# Patient Record
Sex: Female | Born: 1965 | Race: Black or African American | Hispanic: No | Marital: Married | Smoking: Never smoker
Health system: Southern US, Community
[De-identification: ages and names within clinical notes are randomized; demographics above are authoritative.]

---

## 1998-02-08 ENCOUNTER — Other Ambulatory Visit: Admission: RE | Admit: 1998-02-08 | Discharge: 1998-02-08 | Payer: Self-pay | Admitting: *Deleted

## 1998-04-01 ENCOUNTER — Inpatient Hospital Stay (HOSPITAL_COMMUNITY): Admission: RE | Admit: 1998-04-01 | Discharge: 1998-04-02 | Payer: Self-pay | Admitting: *Deleted

## 1999-02-11 ENCOUNTER — Emergency Department (HOSPITAL_COMMUNITY): Admission: EM | Admit: 1999-02-11 | Discharge: 1999-02-11 | Payer: Self-pay | Admitting: Emergency Medicine

## 2005-06-12 ENCOUNTER — Emergency Department (HOSPITAL_COMMUNITY): Admission: EM | Admit: 2005-06-12 | Discharge: 2005-06-12 | Payer: Self-pay | Admitting: *Deleted

## 2010-05-01 ENCOUNTER — Emergency Department (HOSPITAL_COMMUNITY)
Admission: EM | Admit: 2010-05-01 | Discharge: 2010-05-02 | Disposition: A | Discharge status: Home / Self Care | Payer: BC Managed Care – PPO | Attending: Emergency Medicine | Admitting: Emergency Medicine

## 2010-05-01 DIAGNOSIS — R490 Dysphonia: Secondary | ICD-10-CM | POA: Insufficient documentation

## 2010-05-01 DIAGNOSIS — R05 Cough: Secondary | ICD-10-CM | POA: Insufficient documentation

## 2010-05-01 DIAGNOSIS — R5381 Other malaise: Secondary | ICD-10-CM | POA: Insufficient documentation

## 2010-05-01 DIAGNOSIS — R6889 Other general symptoms and signs: Secondary | ICD-10-CM | POA: Insufficient documentation

## 2010-05-01 DIAGNOSIS — I517 Cardiomegaly: Secondary | ICD-10-CM | POA: Insufficient documentation

## 2010-05-01 DIAGNOSIS — R5383 Other fatigue: Secondary | ICD-10-CM | POA: Insufficient documentation

## 2010-05-01 DIAGNOSIS — R059 Cough, unspecified: Secondary | ICD-10-CM | POA: Insufficient documentation

## 2010-05-01 DIAGNOSIS — J069 Acute upper respiratory infection, unspecified: Secondary | ICD-10-CM | POA: Insufficient documentation

## 2010-05-01 DIAGNOSIS — J3489 Other specified disorders of nose and nasal sinuses: Secondary | ICD-10-CM | POA: Insufficient documentation

## 2010-05-02 ENCOUNTER — Emergency Department (HOSPITAL_COMMUNITY): Payer: BC Managed Care – PPO

## 2012-10-01 IMAGING — CR DG CHEST 2V
2 series · 2 of 2 positions shown · non-contrast
Comparison: None.

CLINICAL DATA: Productive cough.

CHEST - 2 VIEW

[w chest pa]
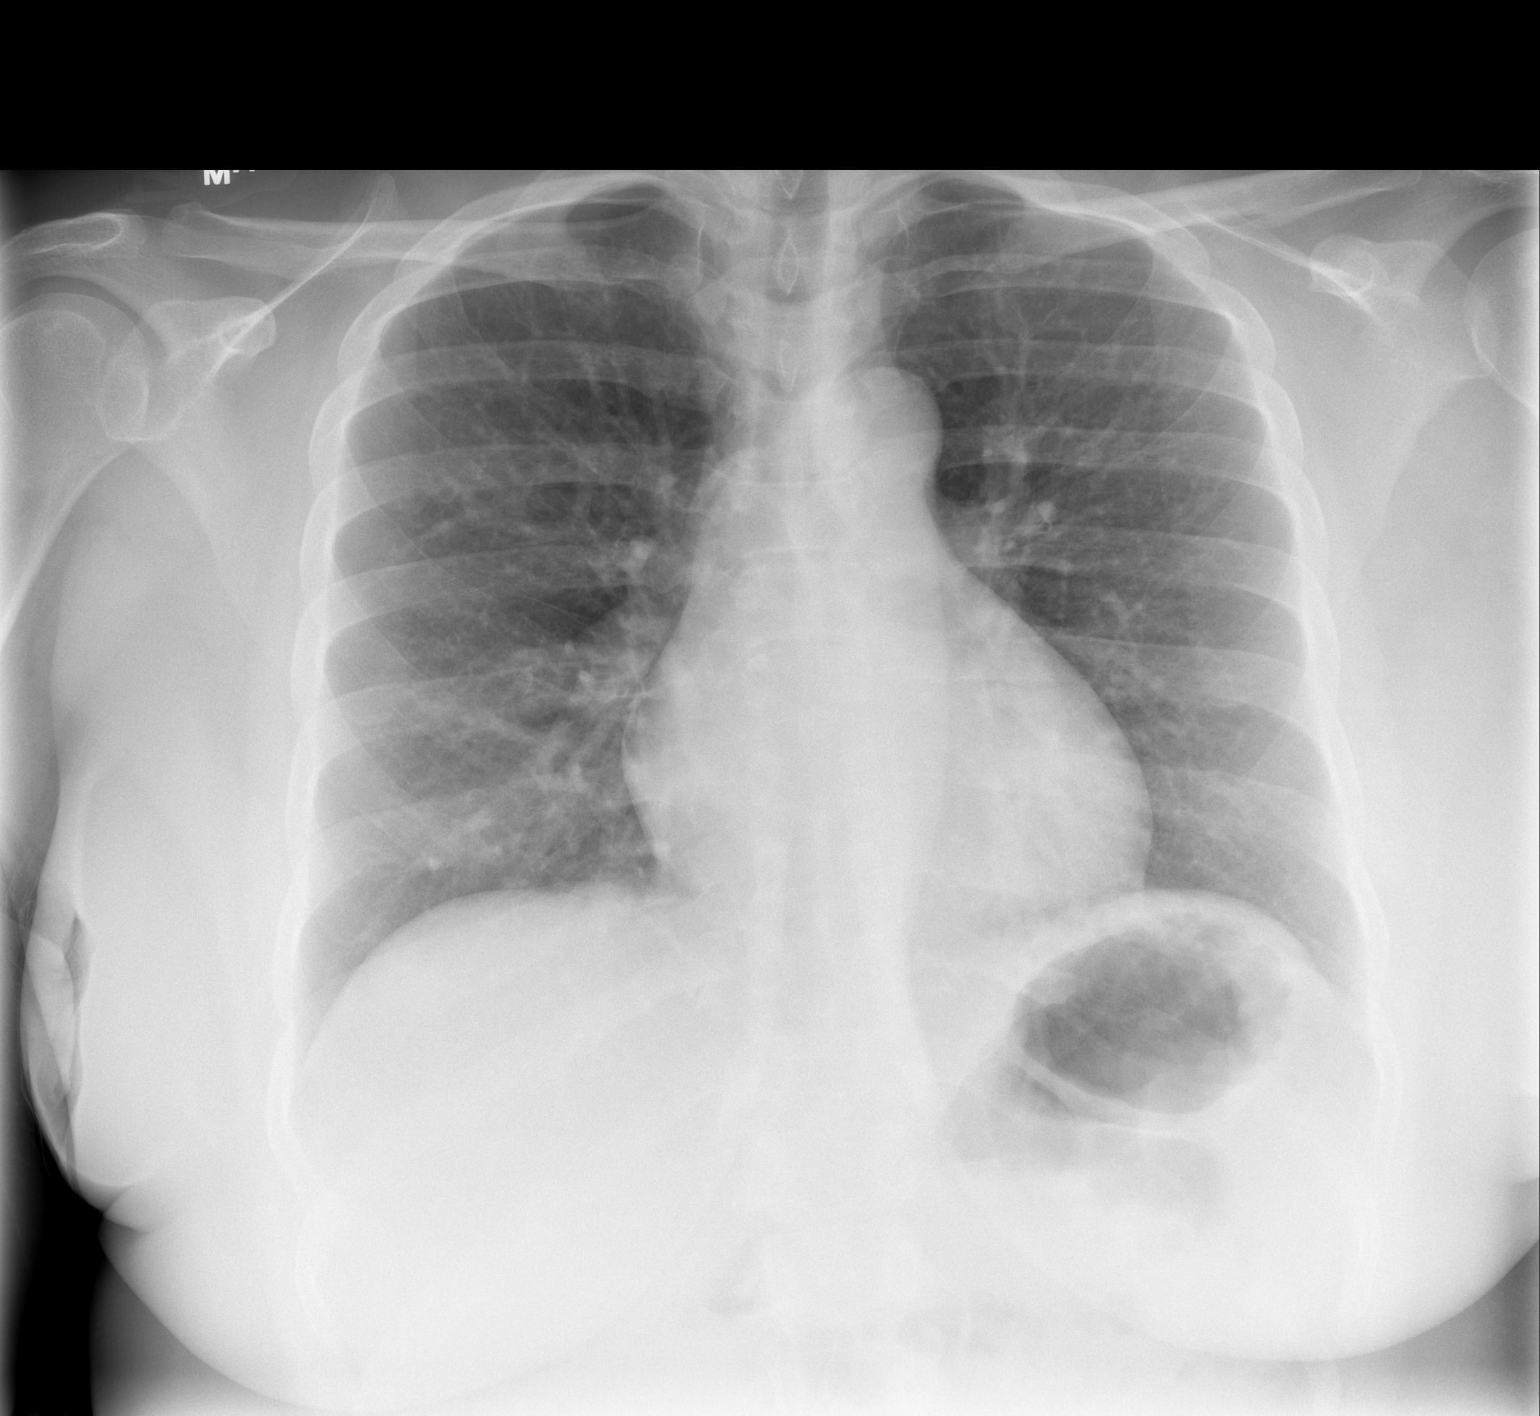

[w chest lat]
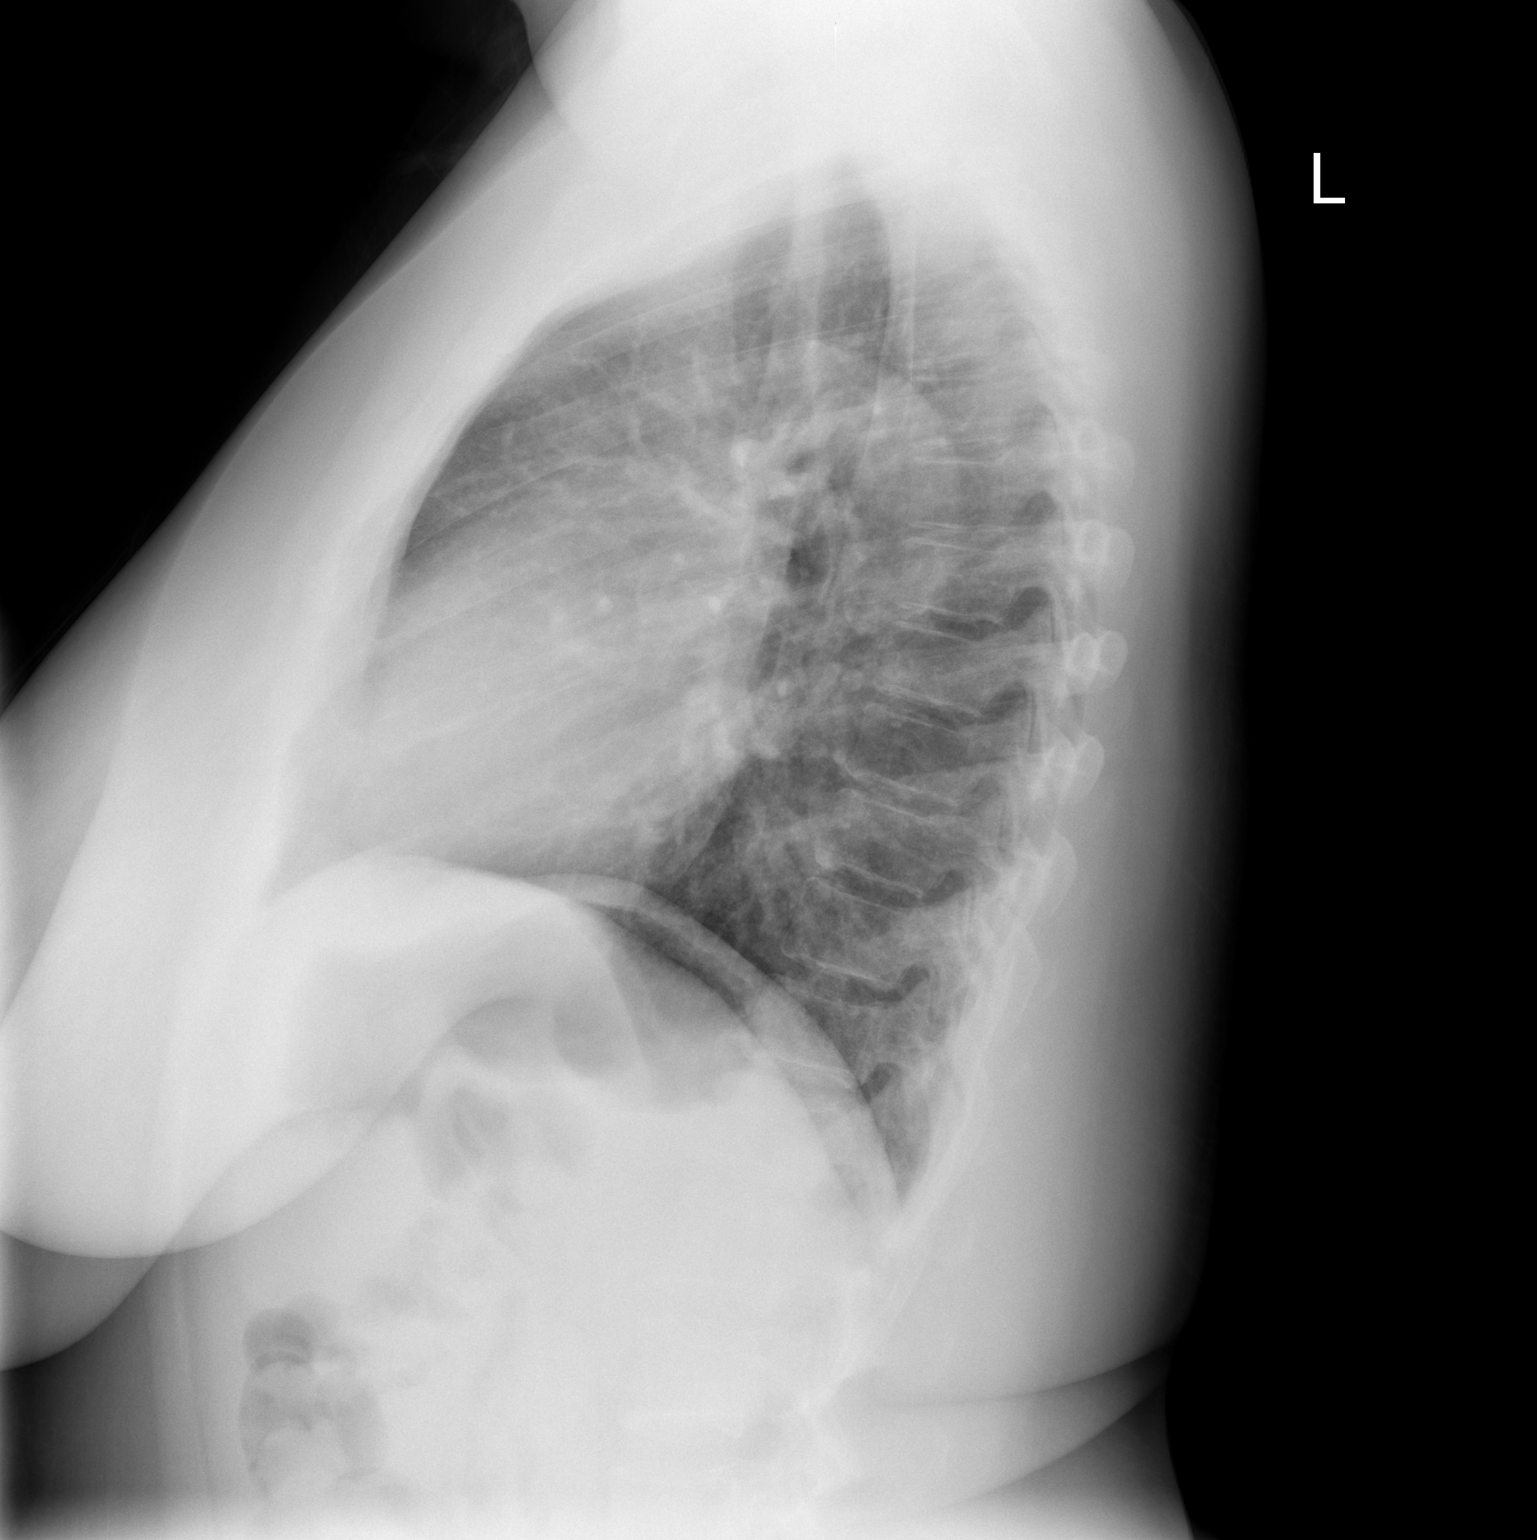

[2 of 2 positions shown; findings below may reference images not displayed]

FINDINGS: Mild cardiomegaly noted.  No findings of edema or
pulmonary venous hypertension.  The lungs appear clear.

No pleural effusion noted.
IMPRESSION: 1.  Mild cardiomegaly.  Otherwise negative.

## 2013-11-24 DIAGNOSIS — Z0271 Encounter for disability determination: Secondary | ICD-10-CM

## 2016-06-30 ENCOUNTER — Ambulatory Visit (INDEPENDENT_AMBULATORY_CARE_PROVIDER_SITE_OTHER): Payer: Worker's Compensation

## 2016-06-30 ENCOUNTER — Ambulatory Visit (INDEPENDENT_AMBULATORY_CARE_PROVIDER_SITE_OTHER): Payer: Worker's Compensation | Admitting: Family Medicine

## 2016-06-30 VITALS — BP 132/89 | HR 72 | Temp 98.2°F | Resp 17 | Ht 62.5 in | Wt 209.0 lb

## 2016-06-30 DIAGNOSIS — S7002XA Contusion of left hip, initial encounter: Secondary | ICD-10-CM

## 2016-06-30 DIAGNOSIS — S0083XA Contusion of other part of head, initial encounter: Secondary | ICD-10-CM

## 2016-06-30 NOTE — Patient Instructions (Addendum)
Your head pain appears to be due to a contusion.  That should continue to improve. Tylenol, ibuprofen or alleve as needed. Return to the clinic or go to the nearest emergency room if any of your symptoms worsen or new symptoms occur.  Your buttocks/hip pain also appears to be due to a contusion or bruising. See information below. I expect that also continue to improve, but you can continue to avoid direct pressure to that area, ice or heat if needed, Tylenol, ibuprofen, or Aleve if needed. We'll adjust your work slightly to help accommodate for that discomfort, but recheck with me in 1 week, sooner if worsening of symptoms.    Contusion A contusion is a deep bruise. Contusions are the result of a blunt injury to tissues and muscle fibers under the skin. The injury causes bleeding under the skin. The skin overlying the contusion may turn blue, purple, or yellow. Minor injuries will give you a painless contusion, but more severe contusions may stay painful and swollen for a few weeks. What are the causes? This condition is usually caused by a blow, trauma, or direct force to an area of the body. What are the signs or symptoms? Symptoms of this condition include:  Swelling of the injured area.  Pain and tenderness in the injured area.  Discoloration. The area may have redness and then turn blue, purple, or yellow. How is this diagnosed? This condition is diagnosed based on a physical exam and medical history. An X-ray, CT scan, or MRI may be needed to determine if there are any associated injuries, such as broken bones (fractures). How is this treated? Specific treatment for this condition depends on what area of the body was injured. In general, the best treatment for a contusion is resting, icing, applying pressure to (compression), and elevating the injured area. This is often called the RICE strategy. Over-the-counter anti-inflammatory medicines may also be recommended for pain control. Follow  these instructions at home:  Rest the injured area.  If directed, apply ice to the injured area:  Put ice in a plastic bag.  Place a towel between your skin and the bag.  Leave the ice on for 20 minutes, 2-3 times per day.  If directed, apply light compression to the injured area using an elastic bandage. Make sure the bandage is not wrapped too tightly. Remove and reapply the bandage as directed by your health care provider.  If possible, raise (elevate) the injured area above the level of your heart while you are sitting or lying down.  Take over-the-counter and prescription medicines only as told by your health care provider. Contact a health care provider if:  Your symptoms do not improve after several days of treatment.  Your symptoms get worse.  You have difficulty moving the injured area. Get help right away if:  You have severe pain.  You have numbness in a hand or foot.  Your hand or foot turns pale or cold. This information is not intended to replace advice given to you by your health care provider. Make sure you discuss any questions you have with your health care provider. Document Released: 12/17/2004 Document Revised: 07/18/2015 Document Reviewed: 07/25/2014 Elsevier Interactive Patient Education  2017 [employer].   IF you received an x-ray today, you will receive an invoice from [facility]. Please contact [facility] at [phone] with questions or concerns regarding your invoice.   IF you received labwork today, you will receive an invoice from [employer]. Please contact [facility]  at [phone] with questions or concerns regarding your invoice.   Our billing staff will not be able to assist you with questions regarding bills from these companies.  You will be contacted with the lab results as soon as they are available. The fastest way to get your results is to activate your My Chart account. Instructions are located on the last page of  this paperwork. If you have not heard from [place] regarding the results in 2 weeks, please contact this office.

## 2016-06-30 NOTE — Progress Notes (Addendum)
Subjective:  By signing my name below, I, [name], attest that this documentation has been prepared under the direction and in the presence of Shade Flood, MD Electronically Signed: Charline Bills, ED Scribe 06/30/2016 at 12:45 PM.   Patient ID: Sandra Joyce, female    DOB: [birth year], 51 y.o.   MRN: [id number]  Chief Complaint  Patient presents with  . tailbone injury   HPI Sandra Joyce is a 51 y.o. female who presents to Primary Care at [facility] complaining of a tailbone injury sustained at work. Date of injury 06/25/16. 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 She states that she was stepping off a ladder when she fell backwards from the last step. She reports that her left foot caught the rack and she struck her left buttock on the wheel of a rack. Pt states that the fall also caused her to fall forward and strike her forehead on the floor. She denies LOC. Pt reports left buttock pain that is exacerbated with sitting, palpation and uncomfortable with walking. She reports having a HA the day of the incident that resolved with Extra Strength Excedrin. She denies HAs since but reports soreness over the scalp. Pt has tried 1 tablet of Excedrin x3 daily, applying an ice pack 30 minutes x3 daily, heating pad once, elevation with a pillow at night. She denies blurred vision, deep HA, nausea, vomiting, hematuria, constipation, difficulty urinating, dizziness, radiating pain. Pt reports that she is ~60% compared to baseline.  Review of Systems  Eyes: Negative for visual disturbance.  Gastrointestinal: Negative for constipation, nausea and vomiting.  Genitourinary: Negative for difficulty urinating and hematuria.  Musculoskeletal:       +tailbone pain  Skin: Positive for color change.  Neurological: Positive for headaches (resolved). Negative for dizziness and syncope.      Objective:   Physical Exam  Constitutional: She is oriented to person, place, and time. She appears  well-developed and well-nourished. No distress.  HENT:  Head: Normocephalic and atraumatic. Head is without raccoon's eyes and without Battle's sign.  Right Ear: No hemotympanum.  Left Ear: No hemotympanum.  L frontal scalp slightly tender to palpation. Skin intact. No soft tissue swelling. No ecchymosis. Orbits are nontender.  Eyes: Conjunctivae and EOM are normal.  Neck: Neck supple. No tracheal deviation present.  Cardiovascular: Normal rate.   Pulmonary/Chest: Effort normal. No respiratory distress.  Musculoskeletal: Normal range of motion.  Negative seated straight leg raise. Full strength pain free testing L leg. Pain FROM of L hip. Faint ecchymosis at lower L buttocks. Tender over area of bruise and into ischium. Slightly tender along L upper buttock. L SI joint is nontender. Lumbar spine is nontender.  Neurological: She is alert and oriented to person, place, and time.  Non-focal neuro exam  Skin: Skin is warm and dry.  Psychiatric: She has a normal mood and affect. Her behavior is normal.  Nursing note and vitals reviewed.  Vitals:   06/30/16 1213  BP: 132/89  Pulse: 72  Resp: 17  Temp: 98.2 F (36.8 C)  TempSrc: Oral  SpO2: 98%  Weight: 209 lb (94.8 kg)  Height: 5' 2.5" (1.588 m)      Assessment & Plan:   Sandra Joyce is a 51 y.o. female Contusion of left hip, initial encounter - Plan: DG Pelvis 1-2 Views  Contusion of other part of head, initial encounter  Contusion of left buttock/hip and left frontal scalp contusion due to injury at work sustained 06/25/2016.  Scalp contusion is improving, no concerning findings on exam, no signs of concussion at this time.   -Over-the-counter Tylenol or ibuprofen/a if needed.  No concerning findings on pelvis x-ray, suspected contusion of issue and left buttocks with overlying faint hematoma. No red flags on history or exam, strength intact, and symptoms are improving with 60% total  improvement.  -Restrictions/accommodations given for work to lessen discomfort to affected area, okay to try over-the-counter Tylenol or ibuprofen/Aleve if needed.  - recheck in 1 week, sooner if worse.     No orders of the defined types were placed in this encounter.  Patient Instructions   Your head pain appears to be due to a contusion.  That should continue to improve. Tylenol, ibuprofen or alleve as needed. Return to the clinic or go to the nearest emergency room if any of your symptoms worsen or new symptoms occur.  Your buttocks/hip pain also appears to be due to a contusion or bruising. See information below. I expect that also continue to improve, but you can continue to avoid direct pressure to that area, ice or heat if needed, Tylenol, ibuprofen, or Aleve if needed. We'll adjust your work slightly to help accommodate for that discomfort, but recheck with me in 1 week, sooner if worsening of symptoms.    Contusion A contusion is a deep bruise. Contusions are the result of a blunt injury to tissues and muscle fibers under the skin. The injury causes bleeding under the skin. The skin overlying the contusion may turn blue, purple, or yellow. Minor injuries will give you a painless contusion, but more severe contusions may stay painful and swollen for a few weeks. What are the causes? This condition is usually caused by a blow, trauma, or direct force to an area of the body. What are the signs or symptoms? Symptoms of this condition include:  Swelling of the injured area.  Pain and tenderness in the injured area.  Discoloration. The area may have redness and then turn blue, purple, or yellow. How is this diagnosed? This condition is diagnosed based on a physical exam and medical history. An X-ray, CT scan, or MRI may be needed to determine if there are any associated injuries, such as broken bones (fractures). How is this treated? Specific treatment for this condition depends on  what area of the body was injured. In general, the best treatment for a contusion is resting, icing, applying pressure to (compression), and elevating the injured area. This is often called the RICE strategy. Over-the-counter anti-inflammatory medicines may also be recommended for pain control. Follow these instructions at home:  Rest the injured area.  If directed, apply ice to the injured area:  Put ice in a plastic bag.  Place a towel between your skin and the bag.  Leave the ice on for 20 minutes, 2-3 times per day.  If directed, apply light compression to the injured area using an elastic bandage. Make sure the bandage is not wrapped too tightly. Remove and reapply the bandage as directed by your health care provider.  If possible, raise (elevate) the injured area above the level of your heart while you are sitting or lying down.  Take over-the-counter and prescription medicines only as told by your health care provider. Contact a health care provider if:  Your symptoms do not improve after several days of treatment.  Your symptoms get worse.  You have difficulty moving the injured area. Get help right away if:  You have severe pain.  You have numbness in a hand or foot.  Your hand or foot turns pale or cold. This information is not intended to replace advice given to you by your health care provider. Make sure you discuss any questions you have with your health care provider. Document Released: 12/17/2004 Document Revised: 07/18/2015 Document Reviewed: 07/25/2014 Elsevier Interactive Patient Education  2017 [employer].   IF you received an x-ray today, you will receive an invoice from [facility]. Please contact [facility] at [phone] with questions or concerns regarding your invoice.   IF you received labwork today, you will receive an invoice from [place]. Please contact [facility] at [phone] with questions or concerns regarding your  invoice.   Our billing staff will not be able to assist you with questions regarding bills from these companies.  You will be contacted with the lab results as soon as they are available. The fastest way to get your results is to activate your My Chart account. Instructions are located on the last page of this paperwork. If you have not heard from [place] regarding the results in 2 weeks, please contact this office.       I personally performed the services described in this documentation, which was scribed in my presence. The recorded information has been reviewed and considered for accuracy and completeness, addended by me as needed, and agree with information above.  Signed,   Meredith Staggers, MD Primary Care at [facility].  06/30/16 1:39 PM

## 2016-07-02 ENCOUNTER — Ambulatory Visit (INDEPENDENT_AMBULATORY_CARE_PROVIDER_SITE_OTHER): Payer: Worker's Compensation | Admitting: Family Medicine

## 2016-07-02 VITALS — BP 140/88 | HR 67 | Temp 97.3°F | Resp 18 | Ht 62.5 in | Wt 210.0 lb

## 2016-07-02 DIAGNOSIS — G44309 Post-traumatic headache, unspecified, not intractable: Secondary | ICD-10-CM

## 2016-07-02 DIAGNOSIS — R42 Dizziness and giddiness: Secondary | ICD-10-CM | POA: Diagnosis not present

## 2016-07-02 DIAGNOSIS — Y990 Civilian activity done for income or pay: Secondary | ICD-10-CM | POA: Diagnosis not present

## 2016-07-02 DIAGNOSIS — S0990XD Unspecified injury of head, subsequent encounter: Secondary | ICD-10-CM | POA: Diagnosis not present

## 2016-07-02 NOTE — Progress Notes (Signed)
Patient ID: Sandra Joyce, female    DOB: 11/17/1965  Age: 51 y.o. MRN: [id number]  Chief Complaint  Patient presents with  . Headache    with vertigo. Pt was told to come back if any changes before follow up appt date    Subjective:   Patient was at work this morning, having started at about 11 AM. A little over an hour later she had climbed upstairs for something for the third trip upstairs today. She had just gotten up stairs and felt dizzy. Did not pass out. A coworker said she didn't look right and had her sit down. The patient contacted her husband who brought her here to the office. She had been here 2 days ago following a accident that she had one week ago. At that time she had fallen from a ladder, just one step up, but fell at a funny angle landing on her left buttock and flipping around in a fashion that hit her left forehead. She had to lay on the ground for several minutes before she got up though she had not lost consciousness. She has continued to have tenderness of the left side of her forehead and a headache there. Today it felt a little different when she had that episode. She had taken some Aleve at times, but none this morning. She is not on any regular medications. Her husband did not note any other changes in her.  Current allergies, medications, problem list, past/family and social histories reviewed.  Objective:  BP 140/88   Pulse 67   Temp 97.3 F (36.3 C) (Oral)   Resp 18   Ht 5' 2.5" (1.588 m)   Wt 210 lb (95.3 kg)   SpO2 100%   BMI 37.80 kg/m   The patient is fully alert and oriented. She has to stretch out on the exam table some to keep from having pressure on the buttock where she has the contusion, but that is not the big issue today. That is continuing to improve. She is fully alert and oriented. No obvious bruising on the forehead. She is tender on the left side of the forehead. Tenderness really does not extend back into the scalp region or orbit.  She has normal cranial nerves II-12. EOMs are fully intact as are visual fields. Her neck is supple. Motor strength symmetrical. Finger-nose normal. Tandem walk normal. Romberg negative.  Assessment & Plan:   Assessment: 1. Closed head injury, subsequent encounter   2. Post-traumatic headache, not intractable, unspecified chronicity pattern   3. Dizziness and giddiness   4. Work related injury       Plan: No indication at this time for scanning or other major diagnostics. I think this is post closed head injury symptoms, similar to having had a mild concussion though no concussion was documented. The symptoms should subside with a little time, but she will be followed up here as needed. She'll has an appointment for next week and is recommended to keep that.  No orders of the defined types were placed in this encounter.   No orders of the defined types were placed in this encounter.        Patient Instructions   I believe these symptoms were related to your post head contusion problem. Although you did not have a documented concussion, head injuries can cause a little symptoms to occur from time to time for a few weeks after hitting the head. If anything gets abruptly worse please come back in to  get rechecked, otherwise plan to keep your appointment next week.  Cautioned to be careful when climbing the stairs not to overdo it. Whenever you since that you might be getting dizzy sit down or lay down if necessary. If you ever have any dizziness when driving pull off the road and call for assistance.  Make sure you're drinking plenty of fluids, as that can decrease the dizziness spells and headaches.  Consider taking the Aleve in the mornings before going to work.  Return at anytime if necessary or go to the emergency room if you must and this practice is not open.      IF you received an x-ray today, you will receive an invoice from [facility]. Please contact  [facility] Radiology at [phone] with questions or concerns regarding your invoice.   IF you received labwork today, you will receive an invoice from [facility]. Please contact [facility] at [phone] with questions or concerns regarding your invoice.   Our billing staff will not be able to assist you with questions regarding bills from these companies.  You will be contacted with the lab results as soon as they are available. The fastest way to get your results is to activate your My Chart account. Instructions are located on the last page of this paperwork. If you have not heard from [place] regarding the results in 2 weeks, please contact this office.        Return in about 5 days (around 07/07/2016).   Kassia Demarinis, MD 07/02/2016

## 2016-07-02 NOTE — Patient Instructions (Addendum)
I believe these symptoms were related to your post head contusion problem. Although you did not have a documented concussion, head injuries can cause a little symptoms to occur from time to time for a few weeks after hitting the head. If anything gets abruptly worse please come back in to get rechecked, otherwise plan to keep your appointment next week.  Cautioned to be careful when climbing the stairs not to overdo it. Whenever you since that you might be getting dizzy sit down or lay down if necessary. If you ever have any dizziness when driving pull off the road and call for assistance.  Make sure you're drinking plenty of fluids, as that can decrease the dizziness spells and headaches.  Consider taking the Aleve in the mornings before going to work.  Return at anytime if necessary or go to the emergency room if you must and this practice is not open.      IF you received an x-ray today, you will receive an invoice from [facility]. Please contact [facility] at [phone] with questions or concerns regarding your invoice.   IF you received labwork today, you will receive an invoice from [place]. Please contact [facility] at [phone] with questions or concerns regarding your invoice.   Our billing staff will not be able to assist you with questions regarding bills from these companies.  You will be contacted with the lab results as soon as they are available. The fastest way to get your results is to activate your My Chart account. Instructions are located on the last page of this paperwork. If you have not heard from [place] regarding the results in 2 weeks, please contact this office.

## 2016-07-07 ENCOUNTER — Ambulatory Visit (INDEPENDENT_AMBULATORY_CARE_PROVIDER_SITE_OTHER): Payer: Worker's Compensation | Admitting: Family Medicine

## 2016-07-07 VITALS — BP 138/84 | HR 72 | Temp 97.6°F | Resp 18 | Ht 62.5 in | Wt 208.8 lb

## 2016-07-07 DIAGNOSIS — R42 Dizziness and giddiness: Secondary | ICD-10-CM | POA: Diagnosis not present

## 2016-07-07 DIAGNOSIS — S0083XD Contusion of other part of head, subsequent encounter: Secondary | ICD-10-CM | POA: Diagnosis not present

## 2016-07-07 DIAGNOSIS — G44309 Post-traumatic headache, unspecified, not intractable: Secondary | ICD-10-CM | POA: Diagnosis not present

## 2016-07-07 DIAGNOSIS — S7002XD Contusion of left hip, subsequent encounter: Secondary | ICD-10-CM

## 2016-07-07 NOTE — Progress Notes (Signed)
Subjective:  By signing my name below, I, [name], attest that this documentation has been prepared under the direction and in the presence of Shade Flood, MD Electronically Signed: Charline Bills, ED Scribe 07/07/2016 at 10:24 AM.   Patient ID: Sandra Joyce, female    DOB: [birth year], 51 y.o.   MRN: [id number]  Chief Complaint  Patient presents with  . Follow-up    contusion on head and hip    HPI Sandra Joyce is a 51 y.o. female who presents to Primary Care at [facility] for follow-up of injury that occurred at work on 4/5/218. Diagnosed with contusion of buttock and hip and left frontal scalp. XRs were reassuring. 60% improved on initial visit 4/10. Placed on restrictions and OTC Tylenol or Aleve. Seen in follow-up on 4/12 as she had a HA that morning, felt dizzy as walking up stairs, no syncope. Did report occasional Aleve but none that day. Had a non-focal neuro exam. Based on exam and improvement didn't feel necessary to scan or other diagnostic testing. Here for follow-up.   Pt reports feeling dizzy yesterday at work with bending. She states dizziness lasted less than 1 minute and self-resolved. Pt also reports sharp pain in her left scalp at least 4 times daily that has unchanged since initial injury. Pt has treated pain with Aleve twice daily and dizziness by increasing water intake with significant relief. Pt denies slurred speech, weakness, syncope, nausea, vomiting, blurred vision, unsteadiness today.  She states that her left buttock is still a little sore and she is still unable to sleep on that side However, she reports feeling 70-75% improved today.   Review of Systems  Eyes: Negative for visual disturbance.  Gastrointestinal: Negative for nausea and vomiting.  Musculoskeletal: Positive for myalgias. Negative for gait problem.  Neurological: Positive for dizziness (resolved). Negative for syncope, speech difficulty and weakness.        Objective:   Physical Exam  Constitutional: She is oriented to person, place, and time. She appears well-developed and well-nourished. No distress.  HENT:  Head: Normocephalic and atraumatic.  Skin's intact. No ecchymosis. Tender along L frontal scalp but not tender across temporal scalp. Sparing of the orbits.  Eyes: Conjunctivae and EOM are normal. Pupils are equal, round, and reactive to light. Right eye exhibits no nystagmus. Left eye exhibits no nystagmus.  Neck: Neck supple. No tracheal deviation present.  Cardiovascular: Normal rate.   Pulmonary/Chest: Effort normal. No respiratory distress.  Musculoskeletal: Normal range of motion.  Pain free ROM. Negative seated straight leg raise. Tender along L buttock. Lumbar spine non-tender. SI joint non-tender.  Neurological: She is alert and oriented to person, place, and time. She displays a negative Romberg sign. Gait normal.  Normal heel to toe. Normal finger to nose. No pronator drift.  Skin: Skin is warm and dry.  Psychiatric: She has a normal mood and affect. Her behavior is normal.  Nursing note and vitals reviewed.  Vitals:   07/07/16 1001 07/07/16 1019  BP: (!) 150/90 138/84  Pulse: 72   Resp: 18   Temp: 97.6 F (36.4 C)   TempSrc: Oral   SpO2: 99%   Weight: 208 lb 12.8 oz (94.7 kg)   Height: 5' 2.5" (1.588 m)       Assessment & Plan:   Sandra Joyce is a 51 y.o. female Contusion of other part of head, subsequent encounter Dizziness Post-traumatic headache, not intractable, unspecified chronicity pattern  - Contusion of left forehead due  to injury at work on 06/25/16. Intermittent sharp head pains lasting a few seconds, and rare dizziness that improves with drinking more fluids. Differential includes possible post concussion dizziness/headache. Nonfocal exam without any worsening of symptoms. Decided against neuroimaging at this time.  - Make sure to stay well hydrated as reported dizziness improves with  increased fluids. Recheck in 1 week, RTC/ER precautions if worsening sooner. Handout given.  Contusion of left hip, subsequent encounter  - Improving. Continue to avoid direct pressure to affected area, Tylenol if needed, recheck in 1 week. Work note given with restrictions.  No orders of the defined types were placed in this encounter.  Patient Instructions    Make sure to drink plenty of fluids as dehydration can also be a cause of dizziness or lightheadedness. Try to avoid repetitive bending over until dizziness has improved. Tylenol if needed for discomfort is preferable to Aleve for right now. Recheck in 1 week, sooner or to emergency room if any acute worsening of headache or worsening symptoms.  Return to the clinic or go to the nearest emergency room if any of your symptoms worsen or new symptoms occur.   Head Injury, Adult There are many types of head injuries. Head injuries can be as minor as a bump, or they can be more severe. More severe head injuries include:  A jarring injury to the brain (concussion).  A bruise of the brain (contusion). This means there is bleeding in the brain that can cause swelling.  A cracked skull (skull fracture).  Bleeding in the brain that collects, clots, and forms a bump (hematoma). After a head injury, you may need to be observed for a while in the emergency department or urgent care. Sometimes admission to the hospital is needed. After a head injury has happened, most problems occur within the first 24 hours, but side effects may occur up to 7-10 days after the injury. It is important to watch your condition for any changes. What are the causes? There are many possible causes of a head injury. A serious head injury may happen to someone who is in a car accident (motor vehicle collision). Other causes of major head injuries include bicycle or motorcycle accidents, sports injuries, and falls. Risk factors This condition is more likely to occur  in people who:  Drink a lot of alcohol or use drugs.  Are over the age of 69.  Are at risk for falls. What are the symptoms? There are many possible symptoms of a head injury. Visible symptoms of a head injury include a bruise, bump, or bleeding at the site of the injury. Other non-visible symptoms include:  Feeling sleepy or not being able to stay awake.  Passing out.  Headache.  Seizures.  Dizziness.  Confusion.  Memory problems.  Nausea or vomiting. Other possible symptoms that may develop after the head injury include:  Poor attention and concentration.  Fatigue or tiring easily.  Irritability.  Being uncomfortable around bright lights or loud noises.  Anxiety or depression.  Disturbed sleep. How is this diagnosed? This condition can usually be diagnosed based on your symptoms, a description of the injury, and a physical exam. You may also have imaging tests done, such as a CT scan or MRI. You will also be closely watched. How is this treated? Treatment for this condition depends on the severity and type of injury you have. The main goal of treatment is to prevent complications and allow the brain time to heal. For mild head  injury, you may be sent home and treatment may include:  Observation. A responsible adult should stay with you for 24 hours after your injury and check on you often.  Physical rest.  Brain rest.  Pain medicines. For severe brain injury, treatment may include:  Close observation. This includes hospitalization with frequent physical exams. You may need to go to a hospital that specializes in head injury.  Pain medicines.  Breathing support. This may include using a ventilator.  Managing the pressure inside the brain (intracranial pressure, or ICP). This may include:  Monitoring the ICP.  Giving medicines to decrease the ICP.  Positioning you to decrease the ICP.  Medicine to prevent seizures.  Surgery to stop bleeding or to  remove blood clots (craniotomy).  Surgery to remove part of the skull (decompressive craniectomy). This allows room for the brain to swell. Follow these instructions at home: Activity  Rest as much as possible and avoid activities that are physically hard or tiring.  Make sure you get enough sleep.  Limit activities that require a lot of thought or attention, such as:  Watching TV.  Playing memory games and puzzles.  Job-related work or homework.  Working on [employer], and texting.  Avoid activities that could cause another head injury, such as playing sports, until your health care provider approves. Having another head injury, especially before the first one has healed, can be dangerous.  Ask your health care provider when it is safe for you to return to your regular activities, including work or school. Ask your health care provider for a step-by-step plan for gradually returning to activities.  Ask your health care provider when you can drive, ride a bicycle, or use heavy machinery. Your ability to react may be slower after a brain injury. Never do these activities if you are dizzy. Lifestyle  Do not drink alcohol until your health care provider approves, and avoid drug use. Alcohol and certain drugs may slow your recovery and can put you at risk of further injury.  If it is harder than usual to remember things, write them down.  If you are easily distracted, try to do one thing at a time.  Talk with family members or close friends when making important decisions.  Tell your friends, family, a trusted colleague, and work Production designer, theatre/television/film about your injury, symptoms, and restrictions. Have them watch for any new or worsening problems. General instructions  Take over-the-counter and prescription medicines only as told by your health care provider.  Have someone stay with you for 24 hours after your head injury. This person should watch you for any changes in your  symptoms and be ready to seek medical help, as needed.  Keep all follow-up visits as told by your health care provider. This is important. Prevention  Work on improving your balance and strength to avoid falls.  Wear a seatbelt when you are in a moving vehicle.  Wear a helmet when riding a bicycle, skiing, or doing any other sport or activity that has a risk of injury.  Drink alcohol only in moderation.  Take safety measures in your home, such as:  Removing clutter and tripping hazards from floors and stairways.  Using grab bars in bathrooms and handrails by stairs.  Placing non-slip mats on floors and in bathtubs.  Improving lighting in dim areas. Get help right away if:  You have:  A severe headache that is not helped by medicine.  Trouble walking, have weakness in your  arms and legs, or lose your balance.  Clear or bloody fluid coming from your nose or ears.  Changes in your vision.  A seizure.  You vomit.  Your symptoms get worse.  Your speech is slurred.  You pass out.  You are sleepier and have trouble staying awake.  Your pupils change size. These symptoms may represent a serious problem that is an emergency. Do not wait to see if the symptoms will go away. Get medical help right away. Call your local emergency services (911 in the [place] Do not drive yourself to the hospital. This information is not intended to replace advice given to you by your health care provider. Make sure you discuss any questions you have with your health care provider. Document Released: 03/09/2005 Document Revised: 10/04/2015 Document Reviewed: 09/17/2015 Elsevier Interactive Patient Education  2017 [employer].   IF you received an x-ray today, you will receive an invoice from [facility]. Please contact [facility] at [phone] with questions or concerns regarding your invoice.   IF you received labwork today, you will receive an invoice from [place].  Please contact [facility] at [phone] with questions or concerns regarding your invoice.   Our billing staff will not be able to assist you with questions regarding bills from these companies.  You will be contacted with the lab results as soon as they are available. The fastest way to get your results is to activate your My Chart account. Instructions are located on the last page of this paperwork. If you have not heard from [place] regarding the results in 2 weeks, please contact this office.       I personally performed the services described in this documentation, which was scribed in my presence. The recorded information has been reviewed and considered for accuracy and completeness, addended by me as needed, and agree with information above.  Signed,   Meredith Staggers, MD Primary Care at [facility].  07/07/16 10:38 AM

## 2016-07-07 NOTE — Patient Instructions (Addendum)
Make sure to drink plenty of fluids as dehydration can also be a cause of dizziness or lightheadedness. Try to avoid repetitive bending over until dizziness has improved. Tylenol if needed for discomfort is preferable to Aleve for right now. Recheck in 1 week, sooner or to emergency room if any acute worsening of headache or worsening symptoms.  Return to the clinic or go to the nearest emergency room if any of your symptoms worsen or new symptoms occur.   Head Injury, Adult There are many types of head injuries. Head injuries can be as minor as a bump, or they can be more severe. More severe head injuries include:  A jarring injury to the brain (concussion).  A bruise of the brain (contusion). This means there is bleeding in the brain that can cause swelling.  A cracked skull (skull fracture).  Bleeding in the brain that collects, clots, and forms a bump (hematoma). After a head injury, you may need to be observed for a while in the emergency department or urgent care. Sometimes admission to the hospital is needed. After a head injury has happened, most problems occur within the first 24 hours, but side effects may occur up to 7-10 days after the injury. It is important to watch your condition for any changes. What are the causes? There are many possible causes of a head injury. A serious head injury may happen to someone who is in a car accident (motor vehicle collision). Other causes of major head injuries include bicycle or motorcycle accidents, sports injuries, and falls. Risk factors This condition is more likely to occur in people who:  Drink a lot of alcohol or use drugs.  Are over the age of 30.  Are at risk for falls. What are the symptoms? There are many possible symptoms of a head injury. Visible symptoms of a head injury include a bruise, bump, or bleeding at the site of the injury. Other non-visible symptoms include:  Feeling sleepy or not being able to stay  awake.  Passing out.  Headache.  Seizures.  Dizziness.  Confusion.  Memory problems.  Nausea or vomiting. Other possible symptoms that may develop after the head injury include:  Poor attention and concentration.  Fatigue or tiring easily.  Irritability.  Being uncomfortable around bright lights or loud noises.  Anxiety or depression.  Disturbed sleep. How is this diagnosed? This condition can usually be diagnosed based on your symptoms, a description of the injury, and a physical exam. You may also have imaging tests done, such as a CT scan or MRI. You will also be closely watched. How is this treated? Treatment for this condition depends on the severity and type of injury you have. The main goal of treatment is to prevent complications and allow the brain time to heal. For mild head injury, you may be sent home and treatment may include:  Observation. A responsible adult should stay with you for 24 hours after your injury and check on you often.  Physical rest.  Brain rest.  Pain medicines. For severe brain injury, treatment may include:  Close observation. This includes hospitalization with frequent physical exams. You may need to go to a hospital that specializes in head injury.  Pain medicines.  Breathing support. This may include using a ventilator.  Managing the pressure inside the brain (intracranial pressure, or ICP). This may include:  Monitoring the ICP.  Giving medicines to decrease the ICP.  Positioning you to decrease the ICP.  Medicine to prevent  seizures.  Surgery to stop bleeding or to remove blood clots (craniotomy).  Surgery to remove part of the skull (decompressive craniectomy). This allows room for the brain to swell. Follow these instructions at home: Activity  Rest as much as possible and avoid activities that are physically hard or tiring.  Make sure you get enough sleep.  Limit activities that require a lot of thought or  attention, such as:  Watching TV.  Playing memory games and puzzles.  Job-related work or homework.  Working on [employer], and texting.  Avoid activities that could cause another head injury, such as playing sports, until your health care provider approves. Having another head injury, especially before the first one has healed, can be dangerous.  Ask your health care provider when it is safe for you to return to your regular activities, including work or school. Ask your health care provider for a step-by-step plan for gradually returning to activities.  Ask your health care provider when you can drive, ride a bicycle, or use heavy machinery. Your ability to react may be slower after a brain injury. Never do these activities if you are dizzy. Lifestyle  Do not drink alcohol until your health care provider approves, and avoid drug use. Alcohol and certain drugs may slow your recovery and can put you at risk of further injury.  If it is harder than usual to remember things, write them down.  If you are easily distracted, try to do one thing at a time.  Talk with family members or close friends when making important decisions.  Tell your friends, family, a trusted colleague, and work Production designer, theatre/television/film about your injury, symptoms, and restrictions. Have them watch for any new or worsening problems. General instructions  Take over-the-counter and prescription medicines only as told by your health care provider.  Have someone stay with you for 24 hours after your head injury. This person should watch you for any changes in your symptoms and be ready to seek medical help, as needed.  Keep all follow-up visits as told by your health care provider. This is important. Prevention  Work on improving your balance and strength to avoid falls.  Wear a seatbelt when you are in a moving vehicle.  Wear a helmet when riding a bicycle, skiing, or doing any other sport or activity that has a  risk of injury.  Drink alcohol only in moderation.  Take safety measures in your home, such as:  Removing clutter and tripping hazards from floors and stairways.  Using grab bars in bathrooms and handrails by stairs.  Placing non-slip mats on floors and in bathtubs.  Improving lighting in dim areas. Get help right away if:  You have:  A severe headache that is not helped by medicine.  Trouble walking, have weakness in your arms and legs, or lose your balance.  Clear or bloody fluid coming from your nose or ears.  Changes in your vision.  A seizure.  You vomit.  Your symptoms get worse.  Your speech is slurred.  You pass out.  You are sleepier and have trouble staying awake.  Your pupils change size. These symptoms may represent a serious problem that is an emergency. Do not wait to see if the symptoms will go away. Get medical help right away. Call your local emergency services (911 in the [place] Do not drive yourself to the hospital. This information is not intended to replace advice given to you by your health care provider. Make  sure you discuss any questions you have with your health care provider. Document Released: 03/09/2005 Document Revised: 10/04/2015 Document Reviewed: 09/17/2015 Elsevier Interactive Patient Education  2017 [employer].   IF you received an x-ray today, you will receive an invoice from [facility]. Please contact [facility] at [phone] with questions or concerns regarding your invoice.   IF you received labwork today, you will receive an invoice from [place]. Please contact [facility] at [phone] with questions or concerns regarding your invoice.   Our billing staff will not be able to assist you with questions regarding bills from these companies.  You will be contacted with the lab results as soon as they are available. The fastest way to get your results is to activate your My Chart account. Instructions are  located on the last page of this paperwork. If you have not heard from [place] regarding the results in 2 weeks, please contact this office.

## 2016-07-14 ENCOUNTER — Ambulatory Visit (INDEPENDENT_AMBULATORY_CARE_PROVIDER_SITE_OTHER): Payer: Worker's Compensation | Admitting: Family Medicine

## 2016-07-14 VITALS — BP 120/80 | HR 81 | Temp 97.9°F | Resp 18 | Ht 61.42 in | Wt 205.6 lb

## 2016-07-14 DIAGNOSIS — S0093XD Contusion of unspecified part of head, subsequent encounter: Secondary | ICD-10-CM

## 2016-07-14 DIAGNOSIS — S7002XD Contusion of left hip, subsequent encounter: Secondary | ICD-10-CM

## 2016-07-14 DIAGNOSIS — S060X0D Concussion without loss of consciousness, subsequent encounter: Secondary | ICD-10-CM

## 2016-07-14 DIAGNOSIS — R42 Dizziness and giddiness: Secondary | ICD-10-CM | POA: Diagnosis not present

## 2016-07-14 NOTE — Patient Instructions (Signed)
     IF you received an x-ray today, you will receive an invoice from [facility]. Please contact [place] [facility] at [phone] with questions or concerns regarding your invoice.   IF you received labwork today, you will receive an invoice from [facility]. Please contact [facility] at [phone] with questions or concerns regarding your invoice.   Our billing staff will not be able to assist you with questions regarding bills from these companies.  You will be contacted with the lab results as soon as they are available. The fastest way to get your results is to activate your My Chart account. Instructions are located on the last page of this paperwork. If you have not heard from us regarding the results in 2 weeks, please contact this office.     

## 2016-07-14 NOTE — Progress Notes (Signed)
Subjective:    Patient ID: Sandra Joyce, female    DOB: [birth year], 51 y.o.   MRN: [id number]  HPI Sandra Joyce is a 51 y.o. female Here for follow-up from injury that occurred at work. Date of injury 06/25/16. See initial evaluation on 06/30/16. Diagnosed with hip contusion, scalp contusion. Seen in follow-up April 12 by Dr. Alwyn Ren after noted to have dizziness at work. Suspected due to mild concussion, reassuring exam at that time. No imaging obtained. Recheck April 17, improving. Advised to maintain hydration, continued on some restrictions. Hip contusion was also improving at that time.  2 headaches since last visit. Minimal dizziness if bending forward for 10 seconds. Drinking fluids o., no new weakness, blurry vision or other new neurologic symptoms. Taking tylenol twice per day. Feels like HA's are improving. Current job restrictions doing ok. Bending causes some dizziness if leaning forward, but that is also a little better. Had been on vacation past weekend, with rest.  Hip contusion - doing a lot better. Only a little bit of soreness. Walking ok. No weakness.  Improved bruising/swelling.   There are no active problems to display for this patient.  History reviewed. No pertinent past medical history. History reviewed. No pertinent surgical history. No Known Allergies Prior to Admission medications   Not on File   Social History   Social History  . Marital status: Married    Spouse name: N/A  . Number of children: N/A  . Years of education: N/A   Occupational History  . Not on file.   Social History Main Topics  . Smoking status: Never Smoker  . Smokeless tobacco: Never Used  . Alcohol use No  . Drug use: No  . Sexual activity: No   Other Topics Concern  . Not on file   Social History Narrative  . No narrative on file    Review of Systems  Gastrointestinal: Negative for nausea and vomiting.  Skin: Negative for color change (resolved  bruising. ), rash and wound.  Neurological: Positive for dizziness and headaches. Negative for tremors, seizures, syncope, facial asymmetry, speech difficulty, weakness, light-headedness and numbness.       Objective:   Physical Exam  Constitutional: She is oriented to person, place, and time. She appears well-developed and well-nourished. No distress.  HENT:  Head: Normocephalic and atraumatic.  Eyes: Conjunctivae and EOM are normal. Pupils are equal, round, and reactive to light. Right eye exhibits no nystagmus. Left eye exhibits no nystagmus.  Neck: Carotid bruit is not present.  Cardiovascular: Normal rate, regular rhythm, normal heart sounds and intact distal pulses.   Pulmonary/Chest: Effort normal and breath sounds normal.  Abdominal: Soft. She exhibits no pulsatile midline mass. There is no tenderness.  Musculoskeletal:       Left hip: She exhibits normal range of motion, normal strength, no tenderness and no bony tenderness.       Legs: Neurological: She is alert and oriented to person, place, and time. She has normal strength. No cranial nerve deficit or sensory deficit. She displays a negative Romberg sign. Coordination and gait normal.  No pronator drift, normal heel to toe, normal finger to nose.  Skin: Skin is warm and dry.  Psychiatric: She has a normal mood and affect. Her behavior is normal.  Vitals reviewed.    Vitals:   07/14/16 0852 07/14/16 0912  BP: (!) 144/87 120/80  Pulse: 81   Resp: 18   Temp: 97.9 F (36.6 C)   TempSrc: Oral  SpO2: 100%   Weight: 205 lb 9.6 oz (93.3 kg)   Height: 5' 1.42" (1.56 m)       Assessment & Plan:  Sandra Joyce is a 51 y.o. female Contusion of head, unspecified part of head, subsequent encounter - Plan: Care order/instruction Concussion without loss of consciousness, subsequent encounter Dizziness  --stable, improving. Continue restrictions, try to avoid prolonged forward bending as that appears to increased  dizziness. Maintain hydration, recheck in 1 week. RTC precautions if worsening.  Contusion of left hip, subsequent encounter  -Much improved, only minimal discomfort over previously tender area. Restrictions same for now with recheck in 1 week. Episodic Tylenol if needed.   No orders of the defined types were placed in this encounter.  Patient Instructions       IF you received an x-ray today, you will receive an invoice from [facility]. Please contact [facility] at [phone] with questions or concerns regarding your invoice.   IF you received labwork today, you will receive an invoice from [facility]. Please contact [facility] at [phone] with questions or concerns regarding your invoice.   Our billing staff will not be able to assist you with questions regarding bills from these companies.  You will be contacted with the lab results as soon as they are available. The fastest way to get your results is to activate your My Chart account. Instructions are located on the last page of this paperwork. If you have not heard from [place] regarding the results in 2 weeks, please contact this office.       Signed,   Meredith Staggers, MD Primary Care at [facility].  07/14/16 9:08 PM

## 2016-07-21 ENCOUNTER — Ambulatory Visit (INDEPENDENT_AMBULATORY_CARE_PROVIDER_SITE_OTHER): Payer: Worker's Compensation | Admitting: Family Medicine

## 2016-07-21 ENCOUNTER — Other Ambulatory Visit: Payer: Self-pay | Admitting: Family Medicine

## 2016-07-21 ENCOUNTER — Encounter: Payer: Self-pay | Admitting: Family Medicine

## 2016-07-21 VITALS — BP 130/80 | HR 81 | Temp 99.1°F | Resp 16 | Ht 61.0 in | Wt 204.8 lb

## 2016-07-21 DIAGNOSIS — S7002XD Contusion of left hip, subsequent encounter: Secondary | ICD-10-CM

## 2016-07-21 DIAGNOSIS — S060X0D Concussion without loss of consciousness, subsequent encounter: Secondary | ICD-10-CM

## 2016-07-21 DIAGNOSIS — S0093XD Contusion of unspecified part of head, subsequent encounter: Secondary | ICD-10-CM

## 2016-07-21 NOTE — Progress Notes (Signed)
By signing my name below, I, Mesha Guinyard, attest that this documentation has been prepared under the direction and in the presence of Meredith Staggers, MD.  Electronically Signed: Arvilla Market, Medical Scribe. 07/21/16. 2:32 PM.  Subjective:    Patient ID: Sandra Joyce, female    DOB: 1965-05-08, 51 y.o.   MRN: [id number]  HPI Chief Complaint  Patient presents with  . Follow-up    Head contusion, workers comp    HPI Comments: Sandra Joyce is a 51 y.o. female who presents to the Primary Care at [facility] for head contusion follow-up. Date of injury 06/25/16; noted dizziness at work. Possible mild concussion. She had 2 HAs as of her last visit, non focal neuro exam, and was improving. Continued similar restrictions, recommended avoiding prolonged forward bending as that appeared to increase her dizziness.  HAs: Reports 1 dizzy spell after repeatedly going up the stairs and it went away in a few minutes and 1 brief pain in her head that last 2 seconds - not a HA. Pt feels 96% resolved. Pt has not had to use tylenol since her last visit. Pt has been doing more squatting instead of bending over.  Hip Contusion: Also with initial injury, only minimal tenderness at last visit. Episodic towel used. Pt feels like her injury is 100% resolved.  There are no active problems to display for this patient.  No past medical history on file. No Known Allergies Prior to Admission medications   Not on File  Review of Systems  Musculoskeletal: Negative for arthralgias.  Neurological: Positive for dizziness. Negative for headaches.  Psychiatric/Behavioral: Negative for confusion.   Objective:  Physical Exam  Constitutional: She appears well-developed and well-nourished. No distress.  HENT:  Head: Normocephalic and atraumatic.  Eyes: Conjunctivae and EOM are normal. Pupils are equal, round, and reactive to light. Right eye exhibits no nystagmus. Left eye exhibits no  nystagmus.  No dizziness or HAs with rapid alternating movements  Neck: Neck supple.  Cardiovascular: Normal rate, regular rhythm and normal heart sounds.  Exam reveals no gallop and no friction rub.   No murmur heard. Pulmonary/Chest: Effort normal and breath sounds normal. No respiratory distress. She has no wheezes. She has no rales.  Musculoskeletal:  hip and left buttock nontender  Neurological: She is alert.  Skin: Skin is warm and dry.  Psychiatric: She has a normal mood and affect. Her behavior is normal.  Nursing note and vitals reviewed.  Vitals:   07/21/16 1402  BP: 130/80  Pulse: 81  Resp: 16  Temp: 99.1 F (37.3 C)  TempSrc: Oral  SpO2: 100%  Weight: 204 lb 12.8 oz (92.9 kg)  Height:  (1.549 m)  Body mass index is 38.7 kg/m. Assessment & Plan:  [name] is a 51 y.o. female Contusion of head, unspecified part of head, subsequent encounter  Concussion without loss of consciousness, subsequent encounter  Contusion of left hip, subsequent encounter  Hip contusion resolved, nearly complete resolution of headache symptoms/concussion symptoms after contusion to head with injury at work as above.  -Will try return to full duty, no restrictions and recheck in 2 weeks. Episodic Tylenol if needed, can still try to avoid repetitive bending forward, but normal duty trial.  - RTC precautions if worsening symptoms at full duty. Letter provider for work.  No orders of the defined types were placed in this encounter.  Patient Instructions   At this point can try returning to full duty, but if you  have any worsening of symptoms let me know and we can return to restrictions short-term. Continue to try to squat down, avoid leaning forward if possible, but otherwise no restrictions. Tylenol if needed for mild symptoms, return if worsening.   Recheck in 2 weeks.  Return to the clinic or go to the nearest emergency room if any of your symptoms worsen or new  symptoms occur.   IF you received an x-ray today, you will receive an invoice from [facility]. Please contact [facility] at [phone] with questions or concerns regarding your invoice.   IF you received labwork today, you will receive an invoice from [facility]. Please contact [facility] at [phone] with questions or concerns regarding your invoice.   Our billing staff will not be able to assist you with questions regarding bills from these companies.  You will be contacted with the lab results as soon as they are available. The fastest way to get your results is to activate your My Chart account. Instructions are located on the last page of this paperwork. If you have not heard from [place] regarding the results in 2 weeks, please contact this office.      I personally performed the services described in this documentation, which was scribed in my presence. The recorded information has been reviewed and considered for accuracy and completeness, addended by me as needed, and agree with information above.  Signed,   Meredith Staggers, MD Primary Care at [facility].  07/22/16 9:30 AM

## 2016-07-21 NOTE — Patient Instructions (Addendum)
At this point can try returning to full duty, but if you have any worsening of symptoms let me know and we can return to restrictions short-term. Continue to try to squat down, avoid leaning forward if possible, but otherwise no restrictions. Tylenol if needed for mild symptoms, return if worsening.   Recheck in 2 weeks.  Return to the clinic or go to the nearest emergency room if any of your symptoms worsen or new symptoms occur.   IF you received an x-ray today, you will receive an invoice from [facility]. Please contact [facility] at [phone] with questions or concerns regarding your invoice.   IF you received labwork today, you will receive an invoice from [facility]. Please contact [facility] at [phone] with questions or concerns regarding your invoice.   Our billing staff will not be able to assist you with questions regarding bills from these companies.  You will be contacted with the lab results as soon as they are available. The fastest way to get your results is to activate your My Chart account. Instructions are located on the last page of this paperwork. If you have not heard from [place] regarding the results in 2 weeks, please contact this office.

## 2016-08-04 ENCOUNTER — Encounter: Payer: Self-pay | Admitting: Family Medicine

## 2016-08-04 ENCOUNTER — Ambulatory Visit (INDEPENDENT_AMBULATORY_CARE_PROVIDER_SITE_OTHER): Payer: Worker's Compensation | Admitting: Family Medicine

## 2016-08-04 VITALS — BP 133/93 | HR 83 | Temp 98.6°F | Resp 16 | Ht 61.0 in | Wt 204.8 lb

## 2016-08-04 DIAGNOSIS — S0003XD Contusion of scalp, subsequent encounter: Secondary | ICD-10-CM

## 2016-08-04 DIAGNOSIS — S060X0D Concussion without loss of consciousness, subsequent encounter: Secondary | ICD-10-CM | POA: Diagnosis not present

## 2016-08-04 NOTE — Progress Notes (Addendum)
Subjective:  By signing my name below, I, Sandra Joyce, attest that this documentation has been prepared under the direction and in the presence of Shade Flood, MD Electronically Signed: Charline Bills, ED Scribe 08/04/2016 at 11:24 AM.   Patient ID: Sandra Joyce, female    DOB: 1965-12-02, 51 y.o.   MRN: [id number]  Chief Complaint  Patient presents with  . Follow-up    Workers comp - head contusion    HPI Sandra Joyce is a 51 y.o. female who presents to Primary Care at [facility] for follow-up on injury at work. Contusion of head, possible mild concussion from injury on 06/25/16 and hip contusion at that time that has resolved. She had near complete resolution on HA symptoms at 5/1 visit. Return to full duty.   Today, pt denies HA, blurred vision, light-headedness, dizziness, dizziness with walking up stairs or bending forward if under 3 minutes, hip pain. She does report some dizziness and increased pressure sensation in her head with bending forward for longer than 3 minutes. Pt currently states that she is 99% baseline.   Review of Systems  Eyes: Negative for visual disturbance.  Musculoskeletal: Negative for arthralgias.  Neurological: Negative for dizziness, light-headedness and headaches.      Objective:   Physical Exam  Constitutional: She is oriented to person, place, and time. She appears well-developed and well-nourished. No distress.  HENT:  Head: Normocephalic and atraumatic.  Scalp is nontender. No apparent soft tissue swelling.  Eyes: Conjunctivae and EOM are normal. Pupils are equal, round, and reactive to light.  Asymptomatic with rapid eye movement. PERRLA  Neck: Neck supple. No tracheal deviation present.  Neck is nontender. Pain free ROM of c-spine.  Cardiovascular: Normal rate.   Pulmonary/Chest: Effort normal. No respiratory distress.  Musculoskeletal: Normal range of motion.  Neurological: She is alert and oriented to person,  place, and time.  5 minute recall 1/3.  Skin: Skin is warm and dry.  Psychiatric: She has a normal mood and affect. Her behavior is normal.  Nursing note and vitals reviewed.  Vitals:   08/04/16 1027  BP: (!) 133/93  Pulse: 83  Resp: 16  Temp: 98.6 F (37 C)  TempSrc: Oral  SpO2: 100%  Weight: 204 lb 12.8 oz (92.9 kg)  Height: 5\' 1"  (1.549 m)      Assessment & Plan:   Sandra Joyce is a 51 y.o. female Concussion without loss of consciousness, subsequent encounter  Contusion of scalp, subsequent encounter 99% improvement from initial injury on April 5 at work. Hip contusion has resolved, no further headache or dizziness with typical activities. Minimal symptoms with prolonged forward leaning. Tolerating full duty.  -Continue full duty, no restrictions, recheck in one month, consider neuro eval if persistent symptoms with forward bending.   No orders of the defined types were placed in this encounter.  Patient Instructions     As you are doing well on full duty, will continue same and recheck in 1 month. If still having head symptoms with forward bending at that time, may need to have neuro evaluation. Return to the clinic or go to the nearest emergency room if any of your symptoms worsen or new symptoms occur.   Concussion, Adult A concussion is a brain injury from a direct hit (blow) to the head or body. This blow causes the brain to shake quickly back and forth inside the skull. This can damage brain cells and cause chemical changes in the brain. A  concussion may also be known as a mild traumatic brain injury (TBI). Concussions are usually not life-threatening, but the effects of a concussion can be serious. If you have a concussion, you are more likely to experience concussion-like symptoms after a direct blow to the head in the future. What are the causes? This condition is caused by:  A direct blow to the head, such as from running into another player during  a game, being hit in a fight, or hitting your head on a hard surface.  A jolt of the head or neck that causes the brain to move back and forth inside the skull, such as in a car crash. What are the signs or symptoms? The signs of a concussion can be hard to notice. Early on, they may be missed by you, family members, and health care providers. You may look fine but act or feel differently. Symptoms are usually temporary, but they may last for days, weeks, or even longer. Some symptoms may appear right away but other symptoms may not show up for hours or days. Every head injury is different. Symptoms may include:  Headaches. This can include a feeling of pressure in the head.  Memory problems.  Trouble concentrating, organizing, or making decisions.  Slowness in thinking, acting or reacting, speaking, or reading.  Confusion.  Fatigue.  Changes in eating or sleeping patterns.  Problems with coordination or balance.  Nausea or vomiting.  Numbness or tingling.  Sensitivity to light or noise.  Vision or hearing problems.  Reduced sense of smell.  Irritability or mood changes.  Dizziness.  Lack of motivation.  Seeing or hearing things that other people do not see or hear (hallucinations). How is this diagnosed? This condition is diagnosed based on:  Your symptoms.  A description of your injury. You may also have tests, including:  Imaging tests, such as a CT scan or MRI. These are done to look for signs of brain injury.  Neuropsychological tests. These measure your thinking, understanding, learning, and remembering abilities. How is this treated? This condition is treated with physical and mental rest and careful observation, usually at home. If the concussion is severe, you may need to stay home from work for a while. You may be referred to a concussion clinic or to other health care providers for management. It is important that you tell your health care provider  if:  You are taking any medicines, including prescription medicines, over-the-counter medicines, and natural remedies. Some medicines, such as blood thinners (anticoagulants) and aspirin, may increase the chance of complications, such as bleeding.  You are taking or have taken alcohol or illegal drugs. Alcohol and certain other drugs may slow your recovery and can put you at risk of further injury. How fast you will recover from a concussion depends on many factors, such as how severe your concussion is, what part of your brain was injured, how old you are, and how healthy you were before the concussion. Recovery can take time. It is important to wait to return to activity until a health care provider says it is safe to do that and your symptoms are completely gone. Follow these instructions at home: Activity   Limit activities that require a lot of thought or concentration. These may include:  Doing homework or job-related work.  Watching TV.  Working on the computer.  Playing memory games and puzzles.  Rest. Rest helps the brain to heal. Make sure you:  Get plenty of sleep at night.  Avoid staying up late at night.  Keep the same bedtime hours on weekends and weekdays.  Rest during the day. Take naps or rest breaks when you feel tired.  Having another concussion before the first one has healed can be dangerous. Do not do high-risk activities that could cause a second concussion, such as riding a bicycle or playing sports.  Ask your health care provider when you can return to your normal activities, such as school, work, athletics, driving, riding a bicycle, or using heavy machinery. Your ability to react may be slower after a brain injury. Never do these activities if you are dizzy. Your health care provider will likely give you a plan for gradually returning to activities. General instructions   Take over-the-counter and prescription medicines only as told by your health care  provider.  Do not drink alcohol until your health care provider says you can.  If it is harder than usual to remember things, write them down.  If you are easily distracted, try to do one thing at a time. For example, do not try to watch TV while fixing dinner.  Talk with family members or close friends when making important decisions.  Watch your symptoms and tell others to do the same. Complications sometimes occur after a concussion. Older adults with a brain injury may have a higher risk of serious complications, such as a blood clot in the brain.  Tell your teachers, school nurse, school counselor, coach, athletic trainer, or work Production designer, theatre/television/filmmanager about your injury, symptoms, and restrictions. Tell them about what you can or cannot do. They should watch for:  Increased problems with attention or concentration.  Increased difficulty remembering or learning new information.  Increased time needed to complete tasks or assignments.  Increased irritability or decreased ability to cope with stress.  Increased symptoms.  Keep all follow-up visits as told by your health care provider. This is important. How is this prevented? It is very important to avoid another brain injury, especially as you recover. In rare cases, another injury can lead to permanent brain damage, brain swelling, or death. The risk of this is greatest during the first 7-10 days after a head injury. Avoid injuries by:  Wearing a seat belt when riding in a car.  Wearing a helmet when biking, skiing, skateboarding, skating, or doing similar activities.  Avoiding activities that could lead to a second concussion, such as contact or recreational sports, until your health care provider says it is okay.  Taking safety measures in your home, such as:  Removing clutter and tripping hazards from floors and stairways.  Using grab bars in bathrooms and handrails by stairs.  Placing non-slip mats on floors and in  bathtubs.  Improving lighting in dim areas. Contact a health care provider if:  Your symptoms get worse.  You have new symptoms.  You continue to have symptoms for more than 2 weeks. Get help right away if:  You have severe or worsening headaches.  You have weakness or numbness in any part of your body.  Your coordination gets worse.  You vomit repeatedly.  You are sleepier.  The pupil of one eye is larger than the other.  You have convulsions or a seizure.  Your speech is slurred.  Your fatigue, confusion, or irritability gets worse.  You cannot recognize people or places.  You have neck pain.  It is difficult to wake you up.  You have unusual behavior changes.  You lose consciousness. Summary  A concussion  is a brain injury from a direct hit (blow) to the head or body.  A concussion may also be called a mild traumatic brain injury (TBI).  You may have imaging tests and neuropsychological tests to diagnose a concussion.  This condition is treated with physical and mental rest and careful observation.  Ask your health care provider when you can return to your normal activities, such as school, work, athletics, driving, riding a bicycle, or using heavy machinery. Follow safety instructions as told by your health care provider. This information is not intended to replace advice given to you by your health care provider. Make sure you discuss any questions you have with your health care provider. Document Released: 05/30/2003 Document Revised: 02/18/2016 Document Reviewed: 02/18/2016 Elsevier Interactive Patient Education  2017 [employer].    IF you received an x-ray today, you will receive an invoice from [facility]. Please contact [facility] at [phone] with questions or concerns regarding your invoice.   IF you received labwork today, you will receive an invoice from [facility]. Please contact [facility] at [phone] with questions  or concerns regarding your invoice.   Our billing staff will not be able to assist you with questions regarding bills from these companies.  You will be contacted with the lab results as soon as they are available. The fastest way to get your results is to activate your My Chart account. Instructions are located on the last page of this paperwork. If you have not heard from [place] regarding the results in 2 weeks, please contact this office.       I personally performed the services described in this documentation, which was scribed in my presence. The recorded information has been reviewed and considered for accuracy and completeness, addended by me as needed, and agree with information above.  Signed,   Meredith Staggers, MD Primary Care at [facility].  08/04/16 1:47 PM

## 2016-08-04 NOTE — Patient Instructions (Addendum)
As you are doing well on full duty, will continue same and recheck in 1 month. If still having head symptoms with forward bending at that time, may need to have neuro evaluation. Return to the clinic or go to the nearest emergency room if any of your symptoms worsen or new symptoms occur.   Concussion, Adult A concussion is a brain injury from a direct hit (blow) to the head or body. This blow causes the brain to shake quickly back and forth inside the skull. This can damage brain cells and cause chemical changes in the brain. A concussion may also be known as a mild traumatic brain injury (TBI). Concussions are usually not life-threatening, but the effects of a concussion can be serious. If you have a concussion, you are more likely to experience concussion-like symptoms after a direct blow to the head in the future. What are the causes? This condition is caused by:  A direct blow to the head, such as from running into another player during a game, being hit in a fight, or hitting your head on a hard surface.  A jolt of the head or neck that causes the brain to move back and forth inside the skull, such as in a car crash. What are the signs or symptoms? The signs of a concussion can be hard to notice. Early on, they may be missed by you, family members, and health care providers. You may look fine but act or feel differently. Symptoms are usually temporary, but they may last for days, weeks, or even longer. Some symptoms may appear right away but other symptoms may not show up for hours or days. Every head injury is different. Symptoms may include:  Headaches. This can include a feeling of pressure in the head.  Memory problems.  Trouble concentrating, organizing, or making decisions.  Slowness in thinking, acting or reacting, speaking, or reading.  Confusion.  Fatigue.  Changes in eating or sleeping patterns.  Problems with coordination or balance.  Nausea or  vomiting.  Numbness or tingling.  Sensitivity to light or noise.  Vision or hearing problems.  Reduced sense of smell.  Irritability or mood changes.  Dizziness.  Lack of motivation.  Seeing or hearing things that other people do not see or hear (hallucinations). How is this diagnosed? This condition is diagnosed based on:  Your symptoms.  A description of your injury. You may also have tests, including:  Imaging tests, such as a CT scan or MRI. These are done to look for signs of brain injury.  Neuropsychological tests. These measure your thinking, understanding, learning, and remembering abilities. How is this treated? This condition is treated with physical and mental rest and careful observation, usually at home. If the concussion is severe, you may need to stay home from work for a while. You may be referred to a concussion clinic or to other health care providers for management. It is important that you tell your health care provider if:  You are taking any medicines, including prescription medicines, over-the-counter medicines, and natural remedies. Some medicines, such as blood thinners (anticoagulants) and aspirin, may increase the chance of complications, such as bleeding.  You are taking or have taken alcohol or illegal drugs. Alcohol and certain other drugs may slow your recovery and can put you at risk of further injury. How fast you will recover from a concussion depends on many factors, such as how severe your concussion is, what part of your brain was injured, how  old you are, and how healthy you were before the concussion. Recovery can take time. It is important to wait to return to activity until a health care provider says it is safe to do that and your symptoms are completely gone. Follow these instructions at home: Activity   Limit activities that require a lot of thought or concentration. These may include:  Doing homework or job-related work.  Watching  TV.  Working on the computer.  Playing memory games and puzzles.  Rest. Rest helps the brain to heal. Make sure you:  Get plenty of sleep at night. Avoid staying up late at night.  Keep the same bedtime hours on weekends and weekdays.  Rest during the day. Take naps or rest breaks when you feel tired.  Having another concussion before the first one has healed can be dangerous. Do not do high-risk activities that could cause a second concussion, such as riding a bicycle or playing sports.  Ask your health care provider when you can return to your normal activities, such as school, work, athletics, driving, riding a bicycle, or using heavy machinery. Your ability to react may be slower after a brain injury. Never do these activities if you are dizzy. Your health care provider will likely give you a plan for gradually returning to activities. General instructions   Take over-the-counter and prescription medicines only as told by your health care provider.  Do not drink alcohol until your health care provider says you can.  If it is harder than usual to remember things, write them down.  If you are easily distracted, try to do one thing at a time. For example, do not try to watch TV while fixing dinner.  Talk with family members or close friends when making important decisions.  Watch your symptoms and tell others to do the same. Complications sometimes occur after a concussion. Older adults with a brain injury may have a higher risk of serious complications, such as a blood clot in the brain.  Tell your teachers, school nurse, school counselor, coach, athletic trainer, or work Production designer, theatre/television/film about your injury, symptoms, and restrictions. Tell them about what you can or cannot do. They should watch for:  Increased problems with attention or concentration.  Increased difficulty remembering or learning new information.  Increased time needed to complete tasks or assignments.  Increased  irritability or decreased ability to cope with stress.  Increased symptoms.  Keep all follow-up visits as told by your health care provider. This is important. How is this prevented? It is very important to avoid another brain injury, especially as you recover. In rare cases, another injury can lead to permanent brain damage, brain swelling, or death. The risk of this is greatest during the first 7-10 days after a head injury. Avoid injuries by:  Wearing a seat belt when riding in a car.  Wearing a helmet when biking, skiing, skateboarding, skating, or doing similar activities.  Avoiding activities that could lead to a second concussion, such as contact or recreational sports, until your health care provider says it is okay.  Taking safety measures in your home, such as:  Removing clutter and tripping hazards from floors and stairways.  Using grab bars in bathrooms and handrails by stairs.  Placing non-slip mats on floors and in bathtubs.  Improving lighting in dim areas. Contact a health care provider if:  Your symptoms get worse.  You have new symptoms.  You continue to have symptoms for more than 2 weeks. Get  help right away if:  You have severe or worsening headaches.  You have weakness or numbness in any part of your body.  Your coordination gets worse.  You vomit repeatedly.  You are sleepier.  The pupil of one eye is larger than the other.  You have convulsions or a seizure.  Your speech is slurred.  Your fatigue, confusion, or irritability gets worse.  You cannot recognize people or places.  You have neck pain.  It is difficult to wake you up.  You have unusual behavior changes.  You lose consciousness. Summary  A concussion is a brain injury from a direct hit (blow) to the head or body.  A concussion may also be called a mild traumatic brain injury (TBI).  You may have imaging tests and neuropsychological tests to diagnose a  concussion.  This condition is treated with physical and mental rest and careful observation.  Ask your health care provider when you can return to your normal activities, such as school, work, athletics, driving, riding a bicycle, or using heavy machinery. Follow safety instructions as told by your health care provider. This information is not intended to replace advice given to you by your health care provider. Make sure you discuss any questions you have with your health care provider. Document Released: 05/30/2003 Document Revised: 02/18/2016 Document Reviewed: 02/18/2016 Elsevier Interactive Patient Education  2017 [employer].    IF you received an x-ray today, you will receive an invoice from [facility]. Please contact [facility] at [phone] with questions or concerns regarding your invoice.   IF you received labwork today, you will receive an invoice from [place]. Please contact [facility] at [phone] with questions or concerns regarding your invoice.   Our billing staff will not be able to assist you with questions regarding bills from these companies.  You will be contacted with the lab results as soon as they are available. The fastest way to get your results is to activate your My Chart account. Instructions are located on the last page of this paperwork. If you have not heard from [place] regarding the results in 2 weeks, please contact this office.

## 2016-09-03 ENCOUNTER — Ambulatory Visit (INDEPENDENT_AMBULATORY_CARE_PROVIDER_SITE_OTHER): Payer: Worker's Compensation | Admitting: Family Medicine

## 2016-09-03 ENCOUNTER — Encounter: Payer: Self-pay | Admitting: Family Medicine

## 2016-09-03 VITALS — BP 133/88 | HR 69 | Temp 98.1°F | Resp 18 | Ht 61.0 in | Wt 206.2 lb

## 2016-09-03 DIAGNOSIS — S0003XA Contusion of scalp, initial encounter: Secondary | ICD-10-CM

## 2016-09-03 DIAGNOSIS — R519 Headache, unspecified: Secondary | ICD-10-CM

## 2016-09-03 DIAGNOSIS — S0003XD Contusion of scalp, subsequent encounter: Secondary | ICD-10-CM | POA: Diagnosis not present

## 2016-09-03 DIAGNOSIS — R51 Headache: Secondary | ICD-10-CM

## 2016-09-03 DIAGNOSIS — R42 Dizziness and giddiness: Secondary | ICD-10-CM | POA: Diagnosis not present

## 2016-09-03 NOTE — Progress Notes (Signed)
By signing my name below, I, Mesha Guinyard, attest that this documentation has been prepared under the direction and in the presence of Meredith Staggers, MD.  Electronically Signed: Arvilla Market, Medical Scribe. 09/03/16. 11:31 AM.  Subjective:    Patient ID: Sandra Joyce, female    DOB: [birth year], 51 y.o.   MRN: [id number]  HPI Chief Complaint  Patient presents with  . Concussion  . Work Related Injury  . Follow-up    HPI Comments: Sandra Joyce is a 51 y.o. female who presents to Primary Care at [facility] for workers-comp concussion follow-up. She had a injury at work where she had a head and hip contusion, possible mild scalp contusion due to injury at work on 06/25/16. She also had a hip contusion at that time of last visit that has resolved. Last visit May 5th she was 99% improved with only minimal sxs with prolonged forward leaning, but was tolerating full duty. She is here for recheck.  After leaving her last appt May 15th, she accidentally hit the right front side of her head with a tape gun resulting in HA that last a couple of hours - the tape gun was jammed. She took aleve for for sxs - no dizziness. She went to work the next day and she had sharp pains in the same location of her recent injury. She now has regular sharp pain once every 3-4 days in that same location. Her last sharp pain was this morning and they last about 8 seconds. She had 3 dizzy episodes last week, none this week, with the longest 15 seconds but they are typically shorter. She has a HA about 1x a week and leaning forwards causes HA after hitting her head. She'll take aleve for relief of HA. She's working full duty and her work duty doesn't worsen her sxs. Denies N/V, light-headedness, and weakness.   No Known Allergies Review of Systems  Gastrointestinal: Negative for nausea and vomiting.  Neurological: Positive for dizziness and headaches. Negative for weakness and light-headedness.    Objective:  Physical Exam  Constitutional: She appears well-developed and well-nourished. No distress.  HENT:  Head: Normocephalic and atraumatic.  Mild tenderness along right frontal scalp without wound or nodule Temples without tendernes  Eyes: Conjunctivae and EOM are normal. Pupils are equal, round, and reactive to light. Right eye exhibits no nystagmus. Left eye exhibits no nystagmus.  Neck: Neck supple.  Cardiovascular: Normal rate.   Pulmonary/Chest: Effort normal.  Neurological: She is alert. She displays a negative Romberg sign.  No pronator drift Nl heel to toe Nl finger to nose Strength was equal  Skin: Skin is warm and dry.  Psychiatric: She has a normal mood and affect. Her behavior is normal.  Nursing note and vitals reviewed.  nonfocal neuro exam.  Vitals:   09/03/16 1102  BP: 133/88  Pulse: 69  Resp: 18  Temp: 98.1 F (36.7 C)  TempSrc: Oral  SpO2: 98%  Weight: 206 lb 3.2 oz (93.5 kg)  Height: 5\' 1"  (1.549 m)  Body mass index is 38.96 kg/m. Assessment & Plan:   Sandra Joyce is a 51 y.o. female Contusion of right temporofrontal scalp, initial encounter - Plan: Ambulatory referral to Neurology  Contusion of left temporofrontal scalp, subsequent encounter - Plan: Ambulatory referral to Neurology  Nonintractable episodic headache, unspecified headache type - Plan: Ambulatory referral to Neurology  Episode of dizziness - Plan: Ambulatory referral to Neurology  Initially stable/improving left frontal scalp contusion and concussion,  with secondary contusion to the right frontal scalp after last visit.   -Due to some persistent headache/dizziness with leaning forward from previous injury and now with episodic headache from right sided injury, will refer to neurology for further evaluation.   -No worsening of symptoms with current work status, so we'll continue on full duty. RTC precautions if worsening or adjustment in restrictions needed.  No  orders of the defined types were placed in this encounter.  Patient Instructions   Okay to continue full duty for now as it does not appear to be changing your symptoms. If you do notice worsening headache or dizziness with your work-related activities, return so we can discuss adjusting restrictions.  Due to the persistent headache and dizziness symptoms I will refer you to neurology for evaluation. Currently this appears to be a head contusion with possible concussion.  Return to the clinic or go to the nearest emergency room if any of your symptoms worsen or new symptoms occur.   Facial or Scalp Contusion A facial or scalp contusion is a deep bruise (contusion) on the face or head. Injuries to the face and head generally cause a lot of swelling, especially around the eyes. Contusions are the result of an injury that caused bleeding under the skin. The contusion may turn blue, purple, or yellow. Minor injuries will give you a painless contusion, but more severe contusions may stay painful and swollen for a few weeks. What are the causes? A facial or scalp contusion is caused by a blunt injury, fall, or trauma to the face or head area. What are the signs or symptoms? Symptoms of this condition include:  Swelling of the injured area.  Discoloration of the injured area.  Tenderness, soreness, or pain in the injured area.  How is this diagnosed? This condition is diagnosed based on your medical history and a physical exam. An X-ray exam, CT scan, or MRI may be needed to check for any additional injuries, such as broken bones (fractures). How is this treated? Often, the best treatment for a facial or scalp contusion is applying cold compresses to the injured area. Over-the-counter medicines may also be recommended for pain control. Follow these instructions at home:  Take over-the-counter and prescription medicines only as told by your health care provider.  If directed, apply ice to the  injured area. ? Put ice in a plastic bag. ? Place a towel between your skin and the bag. ? Leave the ice on for 20 minutes, 2-3 times a day.  Keep all follow-up visits as told by your health care provider. This is important. Contact a health care provider if:  You have trouble biting or chewing.  Your pain or swelling gets worse.  You have pain when you move your eyes. Get help right away if:  You have severe pain or a headache that is not relieved by medicine.  You have unusual sleepiness, confusion, or personality changes.  You vomit.  You have a nosebleed that does not stop.  You have double vision or blurred vision.  You have a continuous clear fluid draining from your nose or ear.  You have trouble walking or using your arms or legs.  You have severe dizziness. Summary  A facial or scalp contusion is a deep bruise (contusion) on the face or head.  Contusions are the result of an injury that caused bleeding under the skin.  Minor injuries will give you a painless contusion, but more severe contusions may stay painful  and swollen for a few weeks.  Often, the best treatment for a facial or scalp contusion is applying cold compresses to the injured area. This information is not intended to replace advice given to you by your health care provider. Make sure you discuss any questions you have with your health care provider. Document Released: 04/16/2004 Document Revised: 01/28/2016 Document Reviewed: 01/28/2016 Elsevier Interactive Patient Education  2017 [employer].     IF you received an x-ray today, you will receive an invoice from [facility]. Please contact [facility] at [phone] with questions or concerns regarding your invoice.   IF you received labwork today, you will receive an invoice from [place]. Please contact [facility] at [phone] with questions or concerns regarding your invoice.   Our billing staff will not be able to  assist you with questions regarding bills from these companies.  You will be contacted with the lab results as soon as they are available. The fastest way to get your results is to activate your My Chart account. Instructions are located on the last page of this paperwork. If you have not heard from [place] regarding the results in 2 weeks, please contact this office.       I personally performed the services described in this documentation, which was scribed in my presence. The recorded information has been reviewed and considered for accuracy and completeness, addended by me as needed, and agree with information above.  Signed,   Meredith Staggers, MD Primary Care at [facility].  09/04/16 3:47 PM

## 2016-09-03 NOTE — Patient Instructions (Addendum)
Okay to continue full duty for now as it does not appear to be changing your symptoms. If you do notice worsening headache or dizziness with your work-related activities, return so we can discuss adjusting restrictions.  Due to the persistent headache and dizziness symptoms I will refer you to neurology for evaluation. Currently this appears to be a head contusion with possible concussion.  Return to the clinic or go to the nearest emergency room if any of your symptoms worsen or new symptoms occur.   Facial or Scalp Contusion A facial or scalp contusion is a deep bruise (contusion) on the face or head. Injuries to the face and head generally cause a lot of swelling, especially around the eyes. Contusions are the result of an injury that caused bleeding under the skin. The contusion may turn blue, purple, or yellow. Minor injuries will give you a painless contusion, but more severe contusions may stay painful and swollen for a few weeks. What are the causes? A facial or scalp contusion is caused by a blunt injury, fall, or trauma to the face or head area. What are the signs or symptoms? Symptoms of this condition include:  Swelling of the injured area.  Discoloration of the injured area.  Tenderness, soreness, or pain in the injured area.  How is this diagnosed? This condition is diagnosed based on your medical history and a physical exam. An X-ray exam, CT scan, or MRI may be needed to check for any additional injuries, such as broken bones (fractures). How is this treated? Often, the best treatment for a facial or scalp contusion is applying cold compresses to the injured area. Over-the-counter medicines may also be recommended for pain control. Follow these instructions at home:  Take over-the-counter and prescription medicines only as told by your health care provider.  If directed, apply ice to the injured area. ? Put ice in a plastic bag. ? Place a towel between your skin and the  bag. ? Leave the ice on for 20 minutes, 2-3 times a day.  Keep all follow-up visits as told by your health care provider. This is important. Contact a health care provider if:  You have trouble biting or chewing.  Your pain or swelling gets worse.  You have pain when you move your eyes. Get help right away if:  You have severe pain or a headache that is not relieved by medicine.  You have unusual sleepiness, confusion, or personality changes.  You vomit.  You have a nosebleed that does not stop.  You have double vision or blurred vision.  You have a continuous clear fluid draining from your nose or ear.  You have trouble walking or using your arms or legs.  You have severe dizziness. Summary  A facial or scalp contusion is a deep bruise (contusion) on the face or head.  Contusions are the result of an injury that caused bleeding under the skin.  Minor injuries will give you a painless contusion, but more severe contusions may stay painful and swollen for a few weeks.  Often, the best treatment for a facial or scalp contusion is applying cold compresses to the injured area. This information is not intended to replace advice given to you by your health care provider. Make sure you discuss any questions you have with your health care provider. Document Released: 04/16/2004 Document Revised: 01/28/2016 Document Reviewed: 01/28/2016 Elsevier Interactive Patient Education  2017 [employer]     IF you received an x-ray today, you will  receive an Economist from [facility]. Please contact [facility] at [phone] with questions or concerns regarding your invoice.   IF you received labwork today, you will receive an invoice from [place]. Please contact [facility] at [phone] with questions or concerns regarding your invoice.   Our billing staff will not be able to assist you with questions regarding bills from these companies.  You will be contacted  with the lab results as soon as they are available. The fastest way to get your results is to activate your My Chart account. Instructions are located on the last page of this paperwork. If you have not heard from [place] regarding the results in 2 weeks, please contact this office.

## 2018-12-26 ENCOUNTER — Other Ambulatory Visit: Payer: Self-pay | Admitting: *Deleted

## 2018-12-26 DIAGNOSIS — Z20822 Contact with and (suspected) exposure to covid-19: Secondary | ICD-10-CM

## 2018-12-28 LAB — NOVEL CORONAVIRUS, NAA: SARS-CoV-2, NAA: NOT DETECTED

## 2022-06-11 ENCOUNTER — Ambulatory Visit (INDEPENDENT_AMBULATORY_CARE_PROVIDER_SITE_OTHER): Payer: Self-pay | Admitting: Adult Health

## 2022-06-11 VITALS — BP 138/82 | HR 77 | Temp 98.0°F | Resp 17 | Ht 61.0 in | Wt 219.8 lb

## 2022-06-11 DIAGNOSIS — J302 Other seasonal allergic rhinitis: Secondary | ICD-10-CM | POA: Insufficient documentation

## 2022-06-11 DIAGNOSIS — Z1211 Encounter for screening for malignant neoplasm of colon: Secondary | ICD-10-CM

## 2022-06-11 DIAGNOSIS — Z113 Encounter for screening for infections with a predominantly sexual mode of transmission: Secondary | ICD-10-CM

## 2022-06-11 DIAGNOSIS — Z7689 Persons encountering health services in other specified circumstances: Secondary | ICD-10-CM

## 2022-06-11 DIAGNOSIS — M159 Polyosteoarthritis, unspecified: Secondary | ICD-10-CM | POA: Insufficient documentation

## 2022-06-11 DIAGNOSIS — Z1231 Encounter for screening mammogram for malignant neoplasm of breast: Secondary | ICD-10-CM

## 2022-06-11 NOTE — Patient Instructions (Signed)
Preventive Care 40-56 Years Old, Female Preventive care refers to lifestyle choices and visits with your health care provider that can promote health and wellness. Preventive care visits are also called wellness exams. What can I expect for my preventive care visit? Counseling Your health care provider may ask you questions about your: Medical history, including: Past medical problems. Family medical history. Pregnancy history. Current health, including: Menstrual cycle. Method of birth control. Emotional well-being. Home life and relationship well-being. Sexual activity and sexual health. Lifestyle, including: Alcohol, nicotine or tobacco, and drug use. Access to firearms. Diet, exercise, and sleep habits. Work and work environment. Sunscreen use. Safety issues such as seatbelt and bike helmet use. Physical exam Your health care provider will check your: Height and weight. These may be used to calculate your BMI (body mass index). BMI is a measurement that tells if you are at a healthy weight. Waist circumference. This measures the distance around your waistline. This measurement also tells if you are at a healthy weight and may help predict your risk of certain diseases, such as type 2 diabetes and high blood pressure. Heart rate and blood pressure. Body temperature. Skin for abnormal spots. What immunizations do I need?  Vaccines are usually given at various ages, according to a schedule. Your health care provider will recommend vaccines for you based on your age, medical history, and lifestyle or other factors, such as travel or where you work. What tests do I need? Screening Your health care provider may recommend screening tests for certain conditions. This may include: Lipid and cholesterol levels. Diabetes screening. This is done by checking your blood sugar (glucose) after you have not eaten for a while (fasting). Pelvic exam and Pap test. Hepatitis B test. Hepatitis C  test. HIV (human immunodeficiency virus) test. STI (sexually transmitted infection) testing, if you are at risk. Lung cancer screening. Colorectal cancer screening. Mammogram. Talk with your health care provider about when you should start having regular mammograms. This may depend on whether you have a family history of breast cancer. BRCA-related cancer screening. This may be done if you have a family history of breast, ovarian, tubal, or peritoneal cancers. Bone density scan. This is done to screen for osteoporosis. Talk with your health care provider about your test results, treatment options, and if necessary, the need for more tests. Follow these instructions at home: Eating and drinking  Eat a diet that includes fresh fruits and vegetables, whole grains, lean protein, and low-fat dairy products. Take vitamin and mineral supplements as recommended by your health care provider. Do not drink alcohol if: Your health care provider tells you not to drink. You are pregnant, may be pregnant, or are planning to become pregnant. If you drink alcohol: Limit how much you have to 0-1 drink a day. Know how much alcohol is in your drink. In the [place] one drink equals one 12 oz bottle of beer (355 mL), one 5 oz glass of wine (148 mL), or one 1 oz glass of hard liquor (44 mL). Lifestyle Brush your teeth every morning and night with fluoride toothpaste. Floss one time each day. Exercise for at least 30 minutes 5 or more days each week. Do not use any products that contain nicotine or tobacco. These products include cigarettes, chewing tobacco, and vaping devices, such as e-cigarettes. If you need help quitting, ask your health care provider. Do not use drugs. If you are sexually active, practice safe sex. Use a condom or other form of protection to   prevent STIs. If you do not wish to become pregnant, use a form of birth control. If you plan to become pregnant, see your health care provider for a  prepregnancy visit. Take aspirin only as told by your health care provider. Make sure that you understand how much to take and what form to take. Work with your health care provider to find out whether it is safe and beneficial for you to take aspirin daily. Find healthy ways to manage stress, such as: Meditation, yoga, or listening to music. Journaling. Talking to a trusted person. Spending time with friends and family. Minimize exposure to UV radiation to reduce your risk of skin cancer. Safety Always wear your seat belt while driving or riding in a vehicle. Do not drive: If you have been drinking alcohol. Do not ride with someone who has been drinking. When you are tired or distracted. While texting. If you have been using any mind-altering substances or drugs. Wear a helmet and other protective equipment during sports activities. If you have firearms in your house, make sure you follow all gun safety procedures. Seek help if you have been physically or sexually abused. What's next? Visit your health care provider once a year for an annual wellness visit. Ask your health care provider how often you should have your eyes and teeth checked. Stay up to date on all vaccines. This information is not intended to replace advice given to you by your health care provider. Make sure you discuss any questions you have with your health care provider. Document Revised: 09/04/2020 Document Reviewed: 09/04/2020 Elsevier Patient Education  2023 Elsevier Inc.  

## 2022-06-11 NOTE — Progress Notes (Signed)
[facility]  Provider: Durenda Age DNP   Code Status:  Full Code  Goals of Care:     06/11/2022    1:10 PM  Advanced Directives  Does Patient Have a Medical Advance Directive? No     Chief Complaint  Patient presents with   New Patient (Initial Visit)    Patient is here to establish care. Pt has some concerns    HPI: Patient is a 57 y.o. female seen today to establish care with [facility]. She is married and has a 62 year old son. She lives with her husband. She completed high school and currently works at [employer]. She complains of pain on her bilateral thumb, 6/10, and right knee, 3/10. She uses splint for her bilateral thumb and Excedrin for the pain. She is unable to open package for chips/condiments due to pain on her thumb. Thumb pain affects her daily life. She had hysterectomy in her 30s due to fibroid tumor.  Her grandmother, sister and son has diabetes. Her son is no longer needing to take medication for diabetes.   No past medical history on file.  No past surgical history on file.  No Known Allergies  No outpatient encounter medications on file as of 06/11/2022.   No facility-administered encounter medications on file as of 06/11/2022.    Review of Systems:  Review of Systems  Constitutional:  Negative for appetite change, chills, fatigue and fever.  HENT:  Negative for congestion, hearing loss, rhinorrhea and sore throat.   Eyes: Negative.   Respiratory:  Negative for cough, shortness of breath and wheezing.   Cardiovascular:  Negative for chest pain, palpitations and leg swelling.  Gastrointestinal:  Negative for abdominal pain, constipation, diarrhea, nausea and vomiting.  Genitourinary:  Negative for dysuria.  Musculoskeletal:  Negative for arthralgias, back pain and myalgias.       Bilateral thumb and right knee pain  Skin:  Negative for color change, rash and wound.  Neurological:  Negative for dizziness, weakness and  headaches.  Psychiatric/Behavioral:  Negative for behavioral problems. The patient is not nervous/anxious.     Health Maintenance  Topic Date Due   COVID-19 Vaccine (1) Never done   HIV Screening  Never done   Hepatitis C Screening  Never done   PAP SMEAR-Modifier  06/11/2022 (Originally 01/26/1988)   INFLUENZA VACCINE  06/21/2022 (Originally 10/21/2021)   Zoster Vaccines- Shingrix (1 of 2) 09/11/2022 (Originally 01/25/2017)   MAMMOGRAM  06/11/2023 (Originally 01/25/2017)   COLONOSCOPY (Pts 45-6yrs Insurance coverage will need to be confirmed)  06/11/2023 (Originally 01/26/2012)   HPV VACCINES  Aged Out   DTaP/Tdap/Td  Discontinued    Physical Exam: Vitals:   06/11/22 1311  BP: 138/82  Pulse: 77  Resp: 17  Temp: 98 F (36.7 C)  TempSrc: Temporal  SpO2: 98%  Weight: 219 lb 12.8 oz (99.7 kg)  Height: 5\' 1"  (1.549 m)   Body mass index is 41.53 kg/m. Physical Exam Constitutional:      Appearance: She is obese.     Comments: Morbidly obese  HENT:     Head: Normocephalic and atraumatic.     Nose: Nose normal.     Mouth/Throat:     Mouth: Mucous membranes are moist.  Eyes:     Conjunctiva/sclera: Conjunctivae normal.  Cardiovascular:     Rate and Rhythm: Normal rate and regular rhythm.  Pulmonary:     Effort: Pulmonary effort is normal.     Breath sounds: Normal breath sounds.  Abdominal:     General: Bowel sounds are normal.     Palpations: Abdomen is soft.  Musculoskeletal:        General: Normal range of motion.     Cervical back: Normal range of motion.  Skin:    General: Skin is warm and dry.  Neurological:     General: No focal deficit present.     Mental Status: She is alert and oriented to person, place, and time.  Psychiatric:        Mood and Affect: Mood normal.        Behavior: Behavior normal.        Thought Content: Thought content normal.        Judgment: Judgment normal.     Labs reviewed: Basic Metabolic Panel: No results for input(s): "NA",  "K", "CL", "CO2", "GLUCOSE", "BUN", "CREATININE", "CALCIUM", "MG", "PHOS", "TSH" in the last 8760 hours. Liver Function Tests: No results for input(s): "AST", "ALT", "ALKPHOS", "BILITOT", "PROT", "ALBUMIN" in the last 8760 hours. No results for input(s): "LIPASE", "AMYLASE" in the last 8760 hours. No results for input(s): "AMMONIA" in the last 8760 hours. CBC: No results for input(s): "WBC", "NEUTROABS", "HGB", "HCT", "MCV", "PLT" in the last 8760 hours. Lipid Panel: No results for input(s): "CHOL", "HDL", "LDLCALC", "TRIG", "CHOLHDL", "LDLDIRECT" in the last 8760 hours. No results found for: "HGBA1C"  Procedures since last visit: No results found.  Assessment/Plan  1. Encounter to establish care -  established care with [facility]  2. Primary osteoarthritis involving multiple joints -  right knee pain 3/10 -  bilateral thumb pain, 6/10, affecting her daily life -  continue Excedrin PRN - Ambulatory referral to Orthopedic Surgery - Complete Metabolic Panel with eGFR; Future - Lipid panel; Future - Hemoglobin A1C; Future  3. Seasonal allergies - loratadine (CLARITIN REDITABS) 10 MG dissolvable tablet; Take 10 mg by mouth daily as needed.  4. Morbidly obese (HCC) Body mass index is 41.53 kg/m. -  wants to lose 5 lbs in a month - Complete Metabolic Panel with eGFR; Future - Lipid panel; Future - Hemoglobin A1C; Future - Amb Ref to Medical Weight Management  5. Colon cancer screening - Ambulatory referral to Gastroenterology  6. Screening examination for STD (sexually transmitted disease) - HIV antibody (with reflex); Future - Hep C Antibody; Future - Hep B Surface Antigen; Future  7. Encounter for screening mammogram for malignant neoplasm of breast - MM Digital Screening    Labs/tests ordered:  - HIV antibody (with reflex); Future - Hep C Antibody; Future - Hep B Surface Antigen; Future - Complete Metabolic Panel with eGFR; Future - Lipid panel; Future - Hemoglobin  A1C; Future - MM Digital Screening  Next appt:  in a month

## 2022-06-12 ENCOUNTER — Other Ambulatory Visit: Payer: Self-pay

## 2022-06-12 DIAGNOSIS — Z113 Encounter for screening for infections with a predominantly sexual mode of transmission: Secondary | ICD-10-CM

## 2022-06-12 DIAGNOSIS — M159 Polyosteoarthritis, unspecified: Secondary | ICD-10-CM

## 2022-06-13 LAB — COMPLETE METABOLIC PANEL WITH GFR
ALT: 8 U/L (ref 6–29)
AST: 12 U/L (ref 10–35)
Albumin: 4 g/dL (ref 3.6–5.1)
Alkaline phosphatase (APISO): 100 U/L (ref 37–153)
BUN: 11 mg/dL (ref 7–25)
CO2: 27 mmol/L (ref 20–32)
Creat: 0.77 mg/dL (ref 0.50–1.03)
Globulin: 3.9 g/dL (calc) — ABNORMAL HIGH (ref 1.9–3.7)
Glucose, Bld: 80 mg/dL (ref 65–99)
Potassium: 3.7 mmol/L (ref 3.5–5.3)
Sodium: 141 mmol/L (ref 135–146)
Total Bilirubin: 0.4 mg/dL (ref 0.2–1.2)
eGFR: 91 mL/min/{1.73_m2} (ref >=60)

## 2022-06-13 LAB — LIPID PANEL
Non-HDL Cholesterol (Calc): 183 mg/dL (calc) — ABNORMAL HIGH (ref <130)
Total CHOL/HDL Ratio: 3.6 (calc) (ref <5.0)

## 2022-06-13 LAB — HEMOGLOBIN A1C
Mean Plasma Glucose: 114 mg/dL
eAG (mmol/L): 6.3 mmol/L

## 2022-06-15 LAB — HIV ANTIBODY (ROUTINE TESTING W REFLEX): HIV 1&2 Ab, 4th Generation: NONREACTIVE

## 2022-06-15 LAB — COMPLETE METABOLIC PANEL WITH GFR
AG Ratio: 1 (calc) (ref 1.0–2.5)
Calcium: 9.4 mg/dL (ref 8.6–10.4)
Chloride: 104 mmol/L (ref 98–110)
Total Protein: 7.9 g/dL (ref 6.1–8.1)

## 2022-06-15 LAB — HEPATITIS C ANTIBODY: Hepatitis C Ab: NONREACTIVE

## 2022-06-15 LAB — LIPID PANEL
Cholesterol: 254 mg/dL — ABNORMAL HIGH (ref <200)
HDL: 71 mg/dL (ref >=50)
LDL Cholesterol (Calc): 162 mg/dL (calc) — ABNORMAL HIGH
Triglycerides: 97 mg/dL (ref <150)

## 2022-06-15 LAB — HEPATITIS B SURFACE ANTIGEN: Hepatitis B Surface Ag: NONREACTIVE

## 2022-06-15 LAB — HEMOGLOBIN A1C: Hgb A1c MFr Bld: 5.6 % of total Hgb (ref <5.7)

## 2022-06-25 NOTE — Addendum Note (Signed)
Addended by: Durenda Age C on: 06/25/2022 04:57 PM   Modules accepted: Orders

## 2022-06-30 NOTE — Progress Notes (Signed)
-    cholesterol 254, elevated (normal <200) and LDL 162, elevated (normal <563), recommend to be on low fat diet and do moderate exercise at least 150 minutes/week

## 2024-01-28 ENCOUNTER — Ambulatory Visit: Payer: Self-pay | Admitting: Adult Health

## 2024-01-28 ENCOUNTER — Ambulatory Visit: Payer: Self-pay

## 2024-01-28 VITALS — BP 138/94 | HR 77 | Temp 97.9°F | Ht 61.0 in | Wt 219.8 lb

## 2024-01-28 DIAGNOSIS — M5442 Lumbago with sciatica, left side: Secondary | ICD-10-CM

## 2024-01-28 DIAGNOSIS — I10 Essential (primary) hypertension: Secondary | ICD-10-CM

## 2024-01-28 DIAGNOSIS — E782 Mixed hyperlipidemia: Secondary | ICD-10-CM

## 2024-01-28 MED ORDER — METHOCARBAMOL 750 MG PO TABS: 750.0000 mg | ORAL_TABLET | Freq: Two times a day (BID) | ORAL | Status: AC | PRN

## 2024-01-28 MED ORDER — AMLODIPINE BESYLATE 5 MG PO TABS: 5.0000 mg | ORAL_TABLET | Freq: Every day | ORAL | 1 refills | Status: DC

## 2024-01-28 NOTE — Progress Notes (Signed)
 [facility] clinic  Provider:  Jereld Serum DNP  Code Status:  Full Code  Goals of Care:     06/11/2022    1:10 PM  Advanced Directives  Does Patient Have a Medical Advance Directive? No     Chief Complaint  Patient presents with   Hypertension    Lately her BP is elevated. Unable to start physical therapy until BP is under control. Average about 137-143/107-110   Discussed the use of AI scribe software for clinical note transcription with the patient, who gave verbal consent to proceed.  HPI: Patient is a 58 y.o. female seen today for an acute visit for blood pressure concern discovered during a workman's compensation evaluation.  Her blood pressure was noted to be elevated during a workman's compensation evaluation following a back injury, with readings averaging between 137-143/107-110 mmHg over the past week. No headaches, chest pain, shortness of breath, or palpitations.  She sustained an upper and lower back strain approximately two weeks ago after lifting a heavy object at work. An MRI was performed on November 5th. Current back pain is in the lower mid-back region, with a pain level of 4 out of 10, occasionally radiating to the left leg. She is taking methocarbamol 750 mg twice a day as needed for muscle spasms, prescribed through her work's medical provider, [employer].  Her last lipid panel was over a year ago, and she is not currently taking any medication for hyperlipidemia. Her A1c is 5.6, indicating she is not diabetic.  She works part-time at [employer], where she is required to lift at least 50 pounds. She does not smoke or consume alcohol. She has recently started gardening and is interested in improving her diet and weight management. She is 5'1 tall and weighs 219 pounds.    History reviewed. No pertinent past medical history.  History reviewed. No pertinent surgical history.  No Known Allergies  Outpatient Encounter Medications as of 01/28/2024  Medication Sig    amLODipine (NORVASC) 5 MG tablet Take 1 tablet (5 mg total) by mouth daily.   methocarbamol (ROBAXIN) 750 MG tablet Take 1 tablet (750 mg total) by mouth 2 (two) times daily as needed for muscle spasms.   [DISCONTINUED] meloxicam (MOBIC) 7.5 MG tablet Take 7.5 mg by mouth as needed for pain.   [DISCONTINUED] methocarbamol (ROBAXIN) 500 MG tablet Take 500 mg by mouth 2 (two) times daily.   [DISCONTINUED] loratadine (CLARITIN REDITABS) 10 MG dissolvable tablet Take 10 mg by mouth daily as needed. (Patient not taking: Reported on 01/28/2024)   No facility-administered encounter medications on file as of 01/28/2024.    Review of Systems:  Review of Systems  Constitutional:  Negative for appetite change, chills, fatigue and fever.  HENT:  Negative for congestion, hearing loss, rhinorrhea and sore throat.   Eyes: Negative.   Respiratory:  Negative for cough, shortness of breath and wheezing.   Cardiovascular:  Negative for chest pain, palpitations and leg swelling.  Gastrointestinal:  Negative for abdominal pain, constipation, diarrhea, nausea and vomiting.  Genitourinary:  Negative for dysuria.  Musculoskeletal:  Positive for back pain. Negative for arthralgias and myalgias.       Lower back pain, 4/10  Skin:  Negative for color change, rash and wound.  Neurological:  Negative for dizziness, weakness and headaches.  Psychiatric/Behavioral:  Negative for behavioral problems. The patient is not nervous/anxious.     Health Maintenance  Topic Date Due   Cervical Cancer Screening (HPV/Pap Cotest)  Never done  Mammogram  Never done   Colonoscopy  Never done   Pneumococcal Vaccine: 50+ Years (1 of 1 - PCV) Never done   Zoster Vaccines- Shingrix (1 of 2) Never done   Influenza Vaccine  06/20/2024 (Originally 10/22/2023)   Hepatitis B Vaccines 19-59 Average Risk (1 of 3 - 19+ 3-dose series) 01/26/2025 (Originally [birth year]1986)   Hepatitis C Screening  Completed   HIV Screening  Completed   HPV  VACCINES  Aged Out   Meningococcal B Vaccine  Aged Out   DTaP/Tdap/Td  Discontinued   COVID-19 Vaccine  Discontinued    Physical Exam: Vitals:   01/28/24 0959 01/28/24 1027  BP: (!) 146/98 (!) 138/94  Pulse: 77   Temp: 97.9 F (36.6 C)   TempSrc: Temporal   SpO2: 99%   Weight: 219 lb 12.8 oz (99.7 kg)   Height: 5' 1 (1.549 m)    Body mass index is 41.53 kg/m. Physical Exam Constitutional:      General: She is not in acute distress.    Appearance: She is obese.  HENT:     Head: Normocephalic and atraumatic.     Nose: Nose normal.     Mouth/Throat:     Mouth: Mucous membranes are moist.  Eyes:     Conjunctiva/sclera: Conjunctivae normal.  Cardiovascular:     Rate and Rhythm: Normal rate and regular rhythm.  Pulmonary:     Effort: Pulmonary effort is normal.     Breath sounds: Normal breath sounds.  Abdominal:     General: Bowel sounds are normal.     Palpations: Abdomen is soft.  Musculoskeletal:        General: Normal range of motion.     Cervical back: Normal range of motion.  Skin:    General: Skin is warm and dry.  Neurological:     General: No focal deficit present.     Mental Status: She is alert and oriented to person, place, and time.  Psychiatric:        Mood and Affect: Mood normal.        Behavior: Behavior normal.        Thought Content: Thought content normal.        Judgment: Judgment normal.     Labs reviewed: Basic Metabolic Panel: No results for input(s): NA, K, CL, CO2, GLUCOSE, BUN, CREATININE, CALCIUM, MG, PHOS, TSH in the last 8760 hours. Liver Function Tests: No results for input(s): AST, ALT, ALKPHOS, BILITOT, PROT, ALBUMIN in the last 8760 hours. No results for input(s): LIPASE, AMYLASE in the last 8760 hours. No results for input(s): AMMONIA in the last 8760 hours. CBC: No results for input(s): WBC, NEUTROABS, HGB, HCT, MCV, PLT in the last 8760 hours. Lipid Panel: No results  for input(s): CHOL, HDL, LDLCALC, TRIG, CHOLHDL, LDLDIRECT in the last 8760 hours. Lab Results  Component Value Date   HGBA1C 5.6 06/12/2022    Procedures since last visit: No results found.  Assessment/Plan  1. Primary hypertension (Primary) -  Newly diagnosed with average blood pressure 137-143/107-110 mmHg. Discussed importance of management to prevent complications. Emphasized lifestyle modifications and amlodipine as first-line treatment. - Started amlodipine 5 mg daily. - Instructed to monitor blood pressure daily and maintain a log. - Advised on dietary changes including low salt intake and weight loss. - Prescribed a blood pressure monitor. - amLODipine (NORVASC) 5 MG tablet; Take 1 tablet (5 mg total) by mouth daily.  Dispense: 90 tablet; Refill: 1 - For home use only DME  Other see comment: BP monitor   2. Acute midline low back pain with left-sided sciatica -  Pain rated 4/10. MRI results discussed. - Continue methocarbamol 750 mg twice daily as needed. - methocarbamol (ROBAXIN) 750 MG tablet; Take 1 tablet (750 mg total) by mouth 2 (two) times daily as needed for muscle spasms.  3. Mixed hyperlipidemia -  Elevated cholesterol levels. Discussed dietary modifications and importance of fasting before lab work. - Advised dietary modifications including low carb and less fatty foods. - Will schedule fasting lab work at next visit.  4. Morbidly obese (HCC) -  Morbid obesity with weight 219 lbs, height 5'1. Discussed weight loss importance for health and blood pressure management. Encouraged dietary changes and physical activity. - Advised on weight loss strategies including dietary changes and increased physical activity. - Instructed to contact to find a weight loss program within network.      Labs/tests ordered:   None   Return in about 4 weeks (around 02/25/2024).  Joanthan Hlavacek Medina-Vargas, NP

## 2024-01-28 NOTE — Patient Instructions (Addendum)
   Needs shingles and pneumonia vaccine.  Needs Pap smear, mammogram, colonoscopy

## 2024-01-28 NOTE — Telephone Encounter (Signed)
   FYI Only or Action Required?: FYI only for provider: appointment scheduled on 01/28/2024 at 10:20am with Jereld Fredda Delude .  Patient was last seen in primary care on 06/11/2022 by Medina-Vargas, Jereld BROCKS, NP.  Called Nurse Triage reporting Hypertension.  Symptoms began unsure--last 3 visits at a provider for workers comp patient states they have been high.  Interventions attempted: Nothing.  Symptoms are: unchanged.  Triage Disposition: See Physician Within 24 Hours  Patient/caregiver understands and will follow disposition?: Yes        Blood pressure has been high at the last three visits at at worker's comp provider & patient was advised to let her PCP know.       Copied from CRM #[id number]. Topic: Clinical - Red Word Triage >> Jan 28, 2024  8:09 AM Alfonso ORN wrote: Red Word that prompted transfer to Nurse Triage: patient took blood pressure 3 days ago her blood pressure running around 137/110-107 or 147/110-107 ?   ----------------------------------------------------------------------- From previous Reason for Contact - Scheduling: Patient/patient representative is calling to schedule an appointment. Refer to attachments for appointment information. Reason for Disposition . Systolic BP >= 180 OR Diastolic >= 110  Answer Assessment - Initial Assessment Questions Patient denies any physical symptoms  Patient is advised to call us  back if anything changes or with any further questions/concerns. Patient is advised that if anything worsens to go to the Emergency Room. Patient verbalized understanding.     1. BLOOD PRESSURE: What is your blood pressure? Did you take at least two measurements 5 minutes apart?     Around 147/110 2. ONSET: When did you take your blood pressure?     3 days ago ago 3. HOW: How did you take your blood pressure? (e.g., automatic home BP monitor, visiting nurse)     Blood pressure has been high at the last three visits at at  worker's comp provider & patient was advised to let her PCP know. 4. HISTORY: Do you have a history of high blood pressure?     N/a 5. MEDICINES: Are you taking any medicines for blood pressure? Have you missed any doses recently?     N/a 6. OTHER SYMPTOMS: Do you have any symptoms? (e.g., blurred vision, chest pain, difficulty breathing, headache, weakness)     denies  Protocols used: Blood Pressure - High-A-AH

## 2024-02-21 ENCOUNTER — Other Ambulatory Visit: Payer: Self-pay

## 2024-02-21 ENCOUNTER — Encounter (HOSPITAL_COMMUNITY): Payer: Self-pay

## 2024-02-21 ENCOUNTER — Emergency Department (HOSPITAL_COMMUNITY)
Admission: EM | Admit: 2024-02-21 | Discharge: 2024-02-21 | Disposition: A | Discharge status: Home / Self Care | Payer: Self-pay | Attending: Emergency Medicine | Admitting: Emergency Medicine

## 2024-02-21 DIAGNOSIS — R112 Nausea with vomiting, unspecified: Secondary | ICD-10-CM | POA: Insufficient documentation

## 2024-02-21 LAB — CBC
HCT: 42.8 % (ref 36.0–46.0)
Hemoglobin: 14.1 g/dL (ref 12.0–15.0)
MCH: 30.6 pg (ref 26.0–34.0)
MCHC: 32.9 g/dL (ref 30.0–36.0)
MCV: 92.8 fL (ref 80.0–100.0)
Platelets: 240 K/uL (ref 150–400)
RBC: 4.61 MIL/uL (ref 3.87–5.11)
RDW: 13.5 % (ref 11.5–15.5)
WBC: 15.9 K/uL — ABNORMAL HIGH (ref 4.0–10.5)
nRBC: 0 % (ref 0.0–0.2)

## 2024-02-21 LAB — COMPREHENSIVE METABOLIC PANEL WITH GFR
ALT: 17 U/L (ref 0–44)
AST: 21 U/L (ref 15–41)
Albumin: 3.6 g/dL (ref 3.5–5.0)
Alkaline Phosphatase: 77 U/L (ref 38–126)
Anion gap: 13 (ref 5–15)
BUN: 20 mg/dL (ref 6–20)
CO2: 24 mmol/L (ref 22–32)
Calcium: 8.8 mg/dL — ABNORMAL LOW (ref 8.9–10.3)
Chloride: 102 mmol/L (ref 98–111)
Creatinine, Ser: 0.94 mg/dL (ref 0.44–1.00)
GFR, Estimated: >60 mL/min (ref >60)
Glucose, Bld: 103 mg/dL — ABNORMAL HIGH (ref 70–99)
Potassium: 2.9 mmol/L — ABNORMAL LOW (ref 3.5–5.1)
Sodium: 139 mmol/L (ref 135–145)
Total Bilirubin: 0.5 mg/dL (ref 0.0–1.2)
Total Protein: 7.6 g/dL (ref 6.5–8.1)

## 2024-02-21 LAB — LIPASE, BLOOD: Lipase: 30 U/L (ref 11–51)

## 2024-02-21 MED ORDER — POTASSIUM CHLORIDE CRYS ER 20 MEQ PO TBCR
40.0000 meq | EXTENDED_RELEASE_TABLET | Freq: Once | ORAL | Status: AC
  Administered 2024-02-21: 40 meq via ORAL
  Filled 2024-02-21: qty 2

## 2024-02-21 MED ORDER — ONDANSETRON 4 MG PO TBDP: 4.0000 mg | ORAL_TABLET | Freq: Three times a day (TID) | ORAL | 0 refills | Status: AC | PRN

## 2024-02-21 NOTE — Discharge Instructions (Addendum)
 Return for any problem.  ?

## 2024-02-21 NOTE — ED Triage Notes (Signed)
 Patient bib EMS from work. She bent down to pick something up and felt a pop and sharp pain in her lower back and lower abdomen.  She then became nauseous and dizzy.  EMS reports she was weak and diaphoretic on their arrival and those symptoms resolved when she laid down on stretcher.    Patient is very uncomfortable on arrival to ED. Stating her abdomen and back are still hurting.

## 2024-02-21 NOTE — ED Notes (Signed)
 Patient having bowel incontinence episode in triage room. Denies this ever happening before.

## 2024-02-21 NOTE — ED Provider Triage Note (Signed)
 Emergency Medicine Provider Triage Evaluation Note  Celica Kotowski , a 58 y.o. female  was evaluated in triage.  Pt complains of acute onset back pain..  With history of back pain.  She bent over at work and experienced acute worsening of her baseline back pain.  This was then associated with nausea, sweating, abdominal cramping, diarrhea.  At time of my triage evaluation she is asymptomatic.  Pain, nausea, diarrhea, abdominal cramps have all resolved.  Screening labs ordered.   Review of Systems  Positive: Back pain, nausea, vomiting, diarrhea Negative: Fever, bloody stool or bloody emesis  Physical Exam  BP (!) 148/90 (BP Location: Right Arm)   Pulse 72   Temp 97.7 F (36.5 C) (Oral)   Resp 18   Ht 5' 1 (1.549 m)   Wt 97.1 kg   SpO2 99%   BMI 40.43 kg/m  Gen:   Awake, no distress   Resp:  Normal effort  MSK:   Moves extremities without difficulty  Other:    Medical Decision Making  Medically screening exam initiated at 1:27 PM.  Appropriate orders placed.  Caylie Sandquist was informed that the remainder of the evaluation will be completed by another provider, this initial triage assessment does not replace that evaluation, and the importance of remaining in the ED until their evaluation is complete.     Laurice Maude BROCKS, MD 02/21/24 1328

## 2024-02-21 NOTE — ED Provider Notes (Signed)
 [place] EMERGENCY DEPARTMENT AT [facility] Provider Note   CSN: [id number] Arrival date & time: 02/21/24  1157     Patient presents with: Abdominal Pain and Back Pain   Sandra Joyce is a 58 y.o. female.  {Add pertinent medical, surgical, social history, OB history to HPI:2067} 58 year old female with prior medical history as detailed below presents for evaluation.  Patient with history of chronic back pain.  Patient was working today.  She bent forward.  She experienced acute onset back pain.  Suddenly she had associated nausea, vomiting, sweating, diarrhea.  Symptoms are completely resolved in triage.  They do not return during her evaluation.  The history is provided by the patient and medical records.       Prior to Admission medications   Medication Sig Start Date End Date Taking? Authorizing Provider  amLODipine  (NORVASC ) 5 MG tablet Take 1 tablet (5 mg total) by mouth daily. 01/28/24   Medina-Vargas, Monina C, NP  methocarbamol  (ROBAXIN ) 750 MG tablet Take 1 tablet (750 mg total) by mouth 2 (two) times daily as needed for muscle spasms. 01/28/24   Medina-Vargas, Monina C, NP    Allergies: Shellfish allergy    Review of Systems  All other systems reviewed and are negative.   Updated Vital Signs BP (!) 148/90 (BP Location: Right Arm)   Pulse 72   Temp 97.7 F (36.5 C) (Oral)   Resp 18   Ht 5' 1 (1.549 m)   Wt 97.1 kg   SpO2 99%   BMI 40.43 kg/m   Physical Exam Vitals and nursing note reviewed.  Constitutional:      General: She is not in acute distress.    Appearance: Normal appearance. She is well-developed.  HENT:     Head: Normocephalic and atraumatic.  Eyes:     Conjunctiva/sclera: Conjunctivae normal.     Pupils: Pupils are equal, round, and reactive to light.  Cardiovascular:     Rate and Rhythm: Normal rate and regular rhythm.     Heart sounds: Normal heart sounds.  Pulmonary:     Effort: Pulmonary effort is normal. No  respiratory distress.     Breath sounds: Normal breath sounds.  Abdominal:     General: There is no distension.     Palpations: Abdomen is soft.     Tenderness: There is no abdominal tenderness.  Musculoskeletal:        General: No deformity. Normal range of motion.     Cervical back: Normal range of motion and neck supple.  Skin:    General: Skin is warm and dry.  Neurological:     General: No focal deficit present.     Mental Status: She is alert and oriented to person, place, and time.     (all labs ordered are listed, but only abnormal results are displayed) Labs Reviewed  COMPREHENSIVE METABOLIC PANEL WITH GFR - Abnormal; Notable for the following components:      Result Value   Potassium 2.9 (*)    Glucose, Bld 103 (*)    Calcium 8.8 (*)    All other components within normal limits  CBC - Abnormal; Notable for the following components:   WBC 15.9 (*)    All other components within normal limits  LIPASE, BLOOD  URINALYSIS, ROUTINE W REFLEX MICROSCOPIC    EKG: None  Radiology: No results found.  {Document cardiac monitor, telemetry assessment procedure when appropriate:32947} Procedures   Medications Ordered in the ED  potassium chloride  SA (KLOR-CON M) CR tablet 40 mEq (has no administration in time range)      {Click here for ABCD2, HEART and other calculators REFRESH Note before signing:1}                              Medical Decision Making Amount and/or Complexity of Data Reviewed Labs: ordered.  Risk Prescription drug management.   ***  {Document critical care time when appropriate  Document review of labs and clinical decision tools ie CHADS2VASC2, etc  Document your independent review of radiology images and any outside records  Document your discussion with family members, caretakers and with consultants  Document social determinants of health affecting pt's care  Document your decision making why or why not admission, treatments were  needed:32947:::1}   Final diagnoses:  None    ED Discharge Orders     None

## 2024-02-23 ENCOUNTER — Telehealth: Payer: Self-pay

## 2024-02-23 NOTE — Telephone Encounter (Signed)
 Copied from CRM #[id number]. Topic: Clinical - Lab/Test Results >> Feb 22, 2024 10:19 AM DeAngela L wrote: Reason for CRM: Patient was discharged from the hospital yesterday and told that her white blood cell count is high, and she was told to inform Dr Phyllis asap >> Feb 23, 2024  1:44 PM Fredrica W wrote: Patient called. States she hasn't heard back from provider about what to do about the WBC being high. Hosptial advised her to reach out to PCP. Called [facility] - clinicians currently at lunch. Thank You

## 2024-02-23 NOTE — Telephone Encounter (Signed)
 Does she want to follow up sooner to re-check CBC?

## 2024-02-23 NOTE — Telephone Encounter (Signed)
 We can re-check CBC when she follows up on 02/25/24.

## 2024-02-24 NOTE — Telephone Encounter (Signed)
 In order for me to evaluate you, I need to see you in person in the clinic.

## 2024-02-24 NOTE — Telephone Encounter (Signed)
 Patient does have an appointment with Phyllis Jereld BROCKS, NP on January 6,2026   Message sent to Medina-Vargas, Jereld BROCKS, NP

## 2024-02-25 ENCOUNTER — Ambulatory Visit: Payer: Self-pay | Admitting: Adult Health

## 2024-03-28 ENCOUNTER — Ambulatory Visit: Payer: Self-pay | Admitting: Adult Health

## 2024-03-28 ENCOUNTER — Encounter: Payer: Self-pay | Admitting: Adult Health

## 2024-03-28 VITALS — BP 134/76 | HR 81 | Temp 98.1°F | Ht 61.0 in | Wt 218.6 lb

## 2024-03-28 DIAGNOSIS — Z1231 Encounter for screening mammogram for malignant neoplasm of breast: Secondary | ICD-10-CM

## 2024-03-28 DIAGNOSIS — E782 Mixed hyperlipidemia: Secondary | ICD-10-CM

## 2024-03-28 DIAGNOSIS — G8929 Other chronic pain: Secondary | ICD-10-CM

## 2024-03-28 DIAGNOSIS — Z124 Encounter for screening for malignant neoplasm of cervix: Secondary | ICD-10-CM

## 2024-03-28 DIAGNOSIS — Z1211 Encounter for screening for malignant neoplasm of colon: Secondary | ICD-10-CM

## 2024-03-28 DIAGNOSIS — Z23 Encounter for immunization: Secondary | ICD-10-CM

## 2024-03-28 DIAGNOSIS — I10 Essential (primary) hypertension: Secondary | ICD-10-CM

## 2024-03-28 DIAGNOSIS — Z1212 Encounter for screening for malignant neoplasm of rectum: Secondary | ICD-10-CM

## 2024-03-28 DIAGNOSIS — M5442 Lumbago with sciatica, left side: Secondary | ICD-10-CM

## 2024-03-28 MED ORDER — ACETAMINOPHEN 500 MG PO TABS: 1000.0000 mg | ORAL_TABLET | Freq: Four times a day (QID) | ORAL | Status: AC | PRN

## 2024-03-28 NOTE — Patient Instructions (Signed)
" °  Needs pneumonia and zoster vaccine "

## 2024-03-28 NOTE — Progress Notes (Signed)
 "  [facility]  Provider:  Jereld Serum DNP  Code Status:  Full Code  Goals of Care:     02/21/2024   12:04 PM  Advanced Directives  Does Patient Have a Medical Advance Directive? No     Chief Complaint  Patient presents with   Medical Management of Chronic Issues    4 week follow up. Patient would like to discuss her WBC she was advised that it was elevated   Discussed the use of AI scribe software for clinical note transcription with the patient, who gave verbal consent to proceed.  HPI: Patient is a 59 y.o. female seen today for a 45-month follow up of chronic medical issues.  She has persistent lower back pain radiating to the left side, exacerbated by walking and daily activities. Despite undergoing a cortisone injection and a steroidal injection on March 13, 2024, the pain persists. She rates it as a three out of ten currently. Tylenol , taken as two 500 mg tablets every six hours as needed, does not alleviate her pain. Her condition has prevented her from exercising and has resulted in her being unable to work. She mentions her spine is shifted.  She has hypertension, managed with amlodipine  5 mg daily. Her blood pressure tends to rise before medical appointments due to anxiety but usually decreases once she is calm.  She has mixed hyperlipidemia and struggles with dietary compliance, especially during the holidays, leading to weight gain.   She experienced a recent emotional episode related to her back pain, causing distress while cooking. She has a history of caring for her mother for fourteen years, impacting her ability to maintain her health insurance and personal health care. She does not smoke or drink alcohol and has a 89 year old son.     History reviewed. No pertinent past medical history.  History reviewed. No pertinent surgical history.  Allergies[1]  Outpatient Encounter Medications as of 03/28/2024  Medication Sig   acetaminophen  (TYLENOL ) 500 MG  tablet Take 2 tablets (1,000 mg total) by mouth every 6 (six) hours as needed.   amLODipine  (NORVASC ) 5 MG tablet Take 1 tablet (5 mg total) by mouth daily.   ondansetron  (ZOFRAN -ODT) 4 MG disintegrating tablet Take 1 tablet (4 mg total) by mouth every 8 (eight) hours as needed for nausea or vomiting.   methocarbamol  (ROBAXIN ) 750 MG tablet Take 1 tablet (750 mg total) by mouth 2 (two) times daily as needed for muscle spasms. (Patient not taking: Reported on 03/28/2024)   No facility-administered encounter medications on file as of 03/28/2024.    Review of Systems:  Review of Systems  Constitutional:  Negative for appetite change, chills, fatigue and fever.  HENT:  Negative for congestion, hearing loss, rhinorrhea and sore throat.   Eyes: Negative.   Respiratory:  Negative for cough, shortness of breath and wheezing.   Cardiovascular:  Negative for chest pain, palpitations and leg swelling.  Gastrointestinal:  Negative for abdominal pain, constipation, diarrhea, nausea and vomiting.  Genitourinary:  Negative for dysuria.  Musculoskeletal:  Positive for back pain. Negative for arthralgias and myalgias.  Skin:  Negative for color change, rash and wound.  Neurological:  Negative for dizziness, weakness and headaches.  Psychiatric/Behavioral:  Negative for behavioral problems. The patient is not nervous/anxious.     Health Maintenance  Topic Date Due   Mammogram  Never done   Colonoscopy  Never done   Pneumococcal Vaccine: 50+ Years (1 of 1 - PCV) Never done   Zoster Vaccines- Shingrix (  1 of 2) Never done   Hepatitis B Vaccines 19-59 Average Risk (1 of 3 - 19+ 3-dose series) 01/26/2025 (Originally 01/25/1985)   Cervical Cancer Screening (HPV/Pap Cotest)  03/28/2025 (Originally 01/25/1997)   Influenza Vaccine  Completed   Hepatitis C Screening  Completed   HIV Screening  Completed   HPV VACCINES  Aged Out   Meningococcal B Vaccine  Aged Out   DTaP/Tdap/Td  Discontinued   COVID-19 Vaccine   Discontinued    Physical Exam: Vitals:   03/28/24 0834  BP: 134/76  Pulse: 81  Temp: 98.1 F (36.7 C)  TempSrc: Temporal  SpO2: 99%  Weight: 218 lb 9.6 oz (99.2 kg)  Height: 5' 1 (1.549 m)   Body mass index is 41.3 kg/m. Physical Exam Constitutional:      General: She is not in acute distress.    Appearance: She is obese.  HENT:     Head: Normocephalic and atraumatic.     Nose: Nose normal.     Mouth/Throat:     Mouth: Mucous membranes are moist.  Eyes:     Conjunctiva/sclera: Conjunctivae normal.  Cardiovascular:     Rate and Rhythm: Normal rate and regular rhythm.  Pulmonary:     Effort: Pulmonary effort is normal.     Breath sounds: Normal breath sounds.  Abdominal:     General: Bowel sounds are normal.     Palpations: Abdomen is soft.  Musculoskeletal:        General: Normal range of motion.     Cervical back: Normal range of motion.  Skin:    General: Skin is warm and dry.  Neurological:     General: No focal deficit present.     Mental Status: She is alert and oriented to person, place, and time.  Psychiatric:        Mood and Affect: Mood normal.        Behavior: Behavior normal.        Thought Content: Thought content normal.        Judgment: Judgment normal.     Labs reviewed: Basic Metabolic Panel: Recent Labs    02/21/24 1302  NA 139  K 2.9*  CL 102  CO2 24  GLUCOSE 103*  BUN 20  CREATININE 0.94  CALCIUM 8.8*   Liver Function Tests: Recent Labs    02/21/24 1302  AST 21  ALT 17  ALKPHOS 77  BILITOT 0.5  PROT 7.6  ALBUMIN 3.6   Recent Labs    02/21/24 1302  LIPASE 30   No results for input(s): AMMONIA in the last 8760 hours. CBC: Recent Labs    02/21/24 1302  WBC 15.9*  HGB 14.1  HCT 42.8  MCV 92.8  PLT 240   Lipid Panel: No results for input(s): CHOL, HDL, LDLCALC, TRIG, CHOLHDL, LDLDIRECT in the last 8760 hours. Lab Results  Component Value Date   HGBA1C 5.6 06/12/2022    Procedures since last  visit: No results found.  Assessment/Plan  1. Primary hypertension (Primary) -  Well-controlled with amlodipine . Anxiety-related spikes noted. - Continue amlodipine  5 mg daily. - Hemoglobin A1c - CBC with Differential/Platelets - Comprehensive metabolic panel - TSH  2. Morbidly obese (HCC) -  Contributes to back pain. Discussed dietary control and weight management. - Encouraged dietary control and weight loss. - Discussed potential referral to weight management program, noted as out-of-pocket expense. - Hemoglobin A1c - TSH  3. Chronic midline low back pain with left-sided sciatica -  Pain persists  despite recent steroid injection. Weight loss advised to reduce spinal load. - Continue follow-up with orthopedics on January 12th. - Encouraged weight loss. - Advised against Robaxin  due to sedative effects. - Recommended Tylenol , up to 1000 mg every 6 hours as needed. - acetaminophen  (TYLENOL ) 500 MG tablet; Take 2 tablets (1,000 mg total) by mouth every 6 (six) hours as needed. - Hemoglobin A1c - TSH  4. Mixed hyperlipidemia -  Non-compliance with dietary recommendations. Ordered lipid panel. - Ordered lipid panel. - Encouraged dietary modifications, including increased vegetable intake and balanced meals. - Lipid Panel - Hemoglobin A1c - TSH  5. Screening mammogram for breast cancer - MM Digital Screening  6. Screening for colorectal cancer - Ambulatory referral to Gastroenterology  7. Screening for cervical cancer - Ambulatory referral to Obstetrics / Gynecology  8. Need for influenza vaccination - Flu vaccine trivalent PF, 6mos and older(Flulaval,Afluria,Fluarix,Fluzone)      General Health Maintenance Routine screenings and vaccinations discussed. Initially declined flu vaccine but agreed to other screenings. - Ordered Pap smear, mammogram, and colonoscopy. - Discussed shingles and pneumonia vaccines.     Labs/tests ordered:  lipid panel, CMP, CBC, TSH,  A1C   Return in about 4 weeks (around 04/25/2024).  Sandra Feldmeier Medina-Vargas, NP      [1]  Allergies Allergen Reactions   Shellfish Allergy    "

## 2024-03-29 LAB — CBC WITH DIFFERENTIAL/PLATELET
Absolute Lymphocytes: 3694 {cells}/uL (ref 850–3900)
Absolute Monocytes: 418 {cells}/uL (ref 200–950)
Basophils Absolute: 27 {cells}/uL (ref 0–200)
Basophils Relative: 0.3 %
Eosinophils Absolute: 53 {cells}/uL (ref 15–500)
Eosinophils Relative: 0.6 %
HCT: 41.2 % (ref 35.9–46.0)
Hemoglobin: 13.7 g/dL (ref 11.7–15.5)
MCH: 30.2 pg (ref 27.0–33.0)
MCHC: 33.3 g/dL (ref 31.6–35.4)
MCV: 90.9 fL (ref 81.4–101.7)
MPV: 9.6 fL (ref 7.5–12.5)
Monocytes Relative: 4.7 %
Neutro Abs: 4708 {cells}/uL (ref 1500–7800)
Neutrophils Relative %: 52.9 %
Platelets: 257 Thousand/uL (ref 140–400)
RBC: 4.53 Million/uL (ref 3.80–5.10)
RDW: 14.3 % (ref 11.0–15.0)
Total Lymphocyte: 41.5 %
WBC: 8.9 Thousand/uL (ref 3.8–10.8)

## 2024-03-29 LAB — HEMOGLOBIN A1C
Hgb A1c MFr Bld: 5.7 % — ABNORMAL HIGH
Mean Plasma Glucose: 117 mg/dL
eAG (mmol/L): 6.5 mmol/L

## 2024-03-29 LAB — LIPID PANEL
Cholesterol: 263 mg/dL — ABNORMAL HIGH
HDL: 62 mg/dL
LDL Cholesterol (Calc): 178 mg/dL — ABNORMAL HIGH
Non-HDL Cholesterol (Calc): 201 mg/dL — ABNORMAL HIGH
Total CHOL/HDL Ratio: 4.2 (calc)
Triglycerides: 105 mg/dL

## 2024-03-29 LAB — COMPREHENSIVE METABOLIC PANEL WITH GFR
AG Ratio: 1.3 (calc) (ref 1.0–2.5)
ALT: 17 U/L (ref 6–29)
AST: 16 U/L (ref 10–35)
Albumin: 4.2 g/dL (ref 3.6–5.1)
Alkaline phosphatase (APISO): 98 U/L (ref 37–153)
BUN: 9 mg/dL (ref 7–25)
CO2: 27 mmol/L (ref 20–32)
Calcium: 9.7 mg/dL (ref 8.6–10.4)
Chloride: 105 mmol/L (ref 98–110)
Creat: 0.78 mg/dL (ref 0.50–1.03)
Globulin: 3.2 g/dL (ref 1.9–3.7)
Glucose, Bld: 91 mg/dL (ref 65–139)
Potassium: 4.1 mmol/L (ref 3.5–5.3)
Sodium: 142 mmol/L (ref 135–146)
Total Bilirubin: 0.4 mg/dL (ref 0.2–1.2)
Total Protein: 7.4 g/dL (ref 6.1–8.1)
eGFR: 89 mL/min/1.73m2

## 2024-03-29 LAB — TSH: TSH: 3.13 m[IU]/L (ref 0.40–4.50)

## 2024-03-31 ENCOUNTER — Ambulatory Visit: Payer: Self-pay | Admitting: Adult Health

## 2024-03-31 NOTE — Progress Notes (Signed)
-    cholesterol 263, up from 254 -  LDL 178, up from 162, recommending start of atorvastatin  40 mg daily, if ok, will send eRx to pharmacy A1C 5.7, up from 5.6, ranging as prediabetes -  no anemia -  electrolytes and liver enzymes normal

## 2024-04-03 ENCOUNTER — Other Ambulatory Visit: Payer: Self-pay | Admitting: Adult Health

## 2024-04-03 DIAGNOSIS — E782 Mixed hyperlipidemia: Secondary | ICD-10-CM

## 2024-04-03 MED ORDER — ATORVASTATIN CALCIUM 40 MG PO TABS: 40.0000 mg | ORAL_TABLET | Freq: Every day | ORAL | 3 refills | Status: AC

## 2024-04-03 NOTE — Telephone Encounter (Signed)
 Copied from CRM #[id number]. Topic: Clinical - Lab/Test Results >> Apr 03, 2024 12:48 PM Debby BROCKS wrote: Reason for CRM: Patient missed call for test results. Provided the results. Patient had no follow up questions and would like the atorvastatin  40 mg daily  to be sent to the pharmacy for her

## 2024-04-14 ENCOUNTER — Other Ambulatory Visit: Payer: Self-pay | Admitting: Adult Health

## 2024-04-14 ENCOUNTER — Telehealth: Payer: Self-pay

## 2024-04-14 DIAGNOSIS — I10 Essential (primary) hypertension: Secondary | ICD-10-CM

## 2024-04-14 NOTE — Telephone Encounter (Signed)
 Copied from CRM [id number]. Topic: Clinical - Medication Refill >> Apr 14, 2024 11:06 AM Miquel SAILOR wrote: Medication: amLODipine  (NORVASC ) 5 MG tablet   Has the patient contacted their pharmacy? Yes (Agent: If no, request that the patient contact the pharmacy for the refill. If patient does not wish to contact the pharmacy document the reason why and proceed with request.) (Agent: If yes, when and what did the pharmacy advise?)  This is the patient's preferred pharmacy:  [facility] - [place], [place] - [address] [place] [address] Phone: [phone] Fax: [phone]  Is this the correct pharmacy for this prescription? Yes If no, delete pharmacy and type the correct one.   Has the prescription been filled recently? Yes  Is the patient out of the medication? 3 pills left but needs this before the storm   Has the patient been seen for an appointment in the last year OR does the patient have an upcoming appointment? Yes  Can we respond through MyChart? Yes  Agent: Please be advised that Rx refills may take up to 3 business days. We ask that you follow-up with your pharmacy.

## 2024-04-26 ENCOUNTER — Ambulatory Visit: Payer: Self-pay | Admitting: Adult Health

## 2024-06-05 ENCOUNTER — Ambulatory Visit: Payer: Self-pay | Admitting: Adult Health
